# Patient Record
Sex: Female | Born: 1950 | Race: Black or African American | Hispanic: No | Smoking: Never smoker
Health system: Southern US, Community
[De-identification: ages and names within clinical notes are randomized; demographics above are authoritative.]

## PROBLEM LIST (undated history)

## (undated) DIAGNOSIS — I10 Essential (primary) hypertension: Secondary | ICD-10-CM

## (undated) HISTORY — DX: Essential (primary) hypertension: I10

---

## 1985-05-07 HISTORY — PX: TUBAL LIGATION: SHX77

## 1998-05-07 HISTORY — PX: CARDIAC CATHETERIZATION: SHX172

## 2011-08-10 ENCOUNTER — Encounter (HOSPITAL_COMMUNITY): Payer: Self-pay

## 2011-08-10 ENCOUNTER — Emergency Department (HOSPITAL_COMMUNITY)
Admission: EM | Admit: 2011-08-10 | Discharge: 2011-08-10 | Disposition: A | Discharge status: Home / Self Care | Payer: Self-pay | Source: Home / Self Care | Attending: Family Medicine | Admitting: Family Medicine

## 2011-08-10 ENCOUNTER — Emergency Department (INDEPENDENT_AMBULATORY_CARE_PROVIDER_SITE_OTHER): Payer: Self-pay

## 2011-08-10 DIAGNOSIS — S82409A Unspecified fracture of shaft of unspecified fibula, initial encounter for closed fracture: Secondary | ICD-10-CM

## 2011-08-10 MED ORDER — OXYCODONE-ACETAMINOPHEN 5-325 MG PO TABS: ORAL_TABLET | ORAL | Status: AC

## 2011-08-10 NOTE — ED Notes (Signed)
States she fell, w left foot/ankle folding up under her; significant swelling dorsum and posterior aspect, medial and laterally

## 2011-08-10 NOTE — Discharge Instructions (Signed)
Please call the Orthopaedic provider listed, Dr. Allie Bossier, on Monday and make an appointment for follow up. Take medications as directed for pain relief.

## 2011-08-10 NOTE — ED Provider Notes (Signed)
History     CSN: [id number]  Arrival date & time 08/10/11  1478   First MD Initiated Contact with Patient 08/10/11 1844      Chief Complaint  Patient presents with  . Ankle Pain    (Consider location/radiation/quality/duration/timing/severity/associated sxs/prior treatment) HPI Comments: Christie Rogers presents for evaluation of pain and swelling in her LEFT ankle. She states that she fell down her stairs (the last three), 9 days ago. She has continued to walk on it since that time.  Patient is a 61 y.o. female presenting with ankle pain.  Ankle Pain  The incident occurred more than 1 week ago. The incident occurred at home. The injury mechanism was a fall. The pain is present in the left ankle. The pain is moderate. The pain has been constant since onset. Pertinent negatives include no numbness, no inability to bear weight and no tingling. The symptoms are aggravated by activity, bearing weight and palpation.    History reviewed. No pertinent past medical history.  History reviewed. No pertinent past surgical history.  History reviewed. No pertinent family history.  History  Substance Use Topics  . Smoking status: Not on file  . Smokeless tobacco: Not on file  . Alcohol Use: Not on file    OB History    Grav Para Term Preterm Abortions TAB SAB Ect Mult Living                  Review of Systems  Constitutional: Negative.   HENT: Negative.   Eyes: Negative.   Respiratory: Negative.   Cardiovascular: Negative.   Gastrointestinal: Negative.   Genitourinary: Negative.   Musculoskeletal:       LEFT ankle pain and swelling  Skin: Negative.   Neurological: Negative.  Negative for tingling and numbness.    Allergies  Review of patient's allergies indicates no known allergies.  Home Medications   Current Outpatient Rx  Name Route Sig Dispense Refill  . AMLODIPINE BESYLATE-VALSARTAN 10-160 MG PO TABS Oral Take 1 tablet by mouth daily.    . OXYCODONE-ACETAMINOPHEN 5-325 MG  PO TABS  Take one to two tablets every 4 to 6 hours as needed for pain 20 tablet 0    BP 172/111  Pulse 83  Temp(Src) 98.7 F (37.1 C) (Oral)  Resp 18  SpO2 98%  Physical Exam  Nursing note and vitals reviewed. Constitutional: She is oriented to person, place, and time. She appears well-developed and well-nourished.  HENT:  Head: Normocephalic and atraumatic.  Eyes: EOM are normal.  Neck: Normal range of motion.  Pulmonary/Chest: Effort normal.  Musculoskeletal:       Left ankle: She exhibits decreased range of motion, swelling and ecchymosis. She exhibits no deformity. tenderness. Lateral malleolus, medial malleolus and proximal fibula tenderness found. No AITFL, no CF ligament, no posterior TFL and no head of 5th metatarsal tenderness found.  Neurological: She is alert and oriented to person, place, and time.  Skin: Skin is warm and dry.  Psychiatric: Her behavior is normal.    ED Course  Procedures (including critical care time)  Labs Reviewed - No data to display Dg Ankle Complete Left  08/10/2011  *RADIOLOGY REPORT*  Clinical Data: Fall.  Lateral ankle pain and swelling.  LEFT ANKLE COMPLETE - 3+ VIEW  Comparison: None.  Findings: A Weber B type oblique fracture of the lateral malleolus is present, with overlying soft tissue swelling.  No widening of the tibiotalar joint is observed.  There is a faint linear calcification adjacent to  the medial malleolus which could reflect a tiny avulsion along the deltoid ligament.  The plafond and talar dome appear otherwise intact.  Tibiotalar joint effusion noted on the lateral projection.  A small Achilles calcaneal spur is present.  IMPRESSION:  1.  Weber B fracture involving the lateral malleolus, possibly with a tiny avulsion from the medial malleolus. 2.  Tibiotalar joint effusion.  Original Report Authenticated By: Dellia Cloud, M.D.     1. Closed fibular fracture       MDM  Xray reviewed by radiologist and myself;  fractures per report above; placed in an ankle stirrup splint with posterior leg splint; will follow up with [facility] next week (Dr. Magnus Ivan); given rx for oxycodone/acetaminophen        Renaee Munda, MD 08/10/11 2130

## 2011-08-10 NOTE — Progress Notes (Signed)
Orthopedic Tech Progress Note Patient Details:  Christie Rogers Sep 25, 1950 [id number]  Type of Splint: Post (short);Stirrup Splint Location: left leg Splint Interventions: Application    Raffaele Derise 08/10/2011, 8:59 PM

## 2011-12-24 ENCOUNTER — Ambulatory Visit (INDEPENDENT_AMBULATORY_CARE_PROVIDER_SITE_OTHER): Payer: Self-pay | Admitting: Family Medicine

## 2011-12-24 ENCOUNTER — Encounter: Payer: Self-pay | Admitting: Family Medicine

## 2011-12-24 VITALS — BP 162/107 | HR 88 | Temp 98.0°F | Ht 66.0 in | Wt 232.0 lb

## 2011-12-24 DIAGNOSIS — R05 Cough: Secondary | ICD-10-CM

## 2011-12-24 DIAGNOSIS — I10 Essential (primary) hypertension: Secondary | ICD-10-CM | POA: Insufficient documentation

## 2011-12-24 DIAGNOSIS — R002 Palpitations: Secondary | ICD-10-CM

## 2011-12-24 NOTE — Patient Instructions (Addendum)
We are going to do some bloodwork today.  I will let you know the results.  We will have you come back in about 2 weeks for a blood pressure check.  We may make some changes to your medications at that time.  Record release today.  I'd also like for you to have a chest xray due to the chronic cough that you've been having and the fact that both of your parents had lung cancer.  You can go whenever is best for you in the next 2 weeks.  You don't need an appointment  It was good to meet you today!

## 2011-12-24 NOTE — Progress Notes (Signed)
Patient ID: Christie Rogers, female   DOB: 1950/08/15, 61 y.o.   MRN: [id number] Christie Rogers is a 61 y.o. female who presents to [facility] today to establish care.  Main medical problem is hypertension.  Moved here from [place] after loss of mother and brother.  Daughter lives here in [place].  Been living here for 1 year, hasn't established care since being here.    1.  Hypertension:  Long-term problem for this patient, has had 3 hospitalizations for this in past, what sounds like hypertensive urgency.  Has been taking HCTZ-lisinopril combination. Not checking it regularly.  No HA, CP, dizziness, shortness of breath, palpitations, or LE swelling.  Per patient, she had negative cardiac cath in 2000. BP Readings from Last 3 Encounters:  12/24/11 162/107  08/10/11 172/111   2.  Cough:  Persistent for about one year. She is on an ACE inhibitor but it's only been on this for the past month or so. Cough predated ACE inhibitor use. She describes it as a dry hacking cough that is not worse at night. No history of asthma, postnasal drip, GERD. Both her parents did die from lung cancer. Her father was a smoker however her mother was not. No fevers or chills. No weight loss. No night sweats.  The following portions of the patient's history were reviewed and updated as appropriate: allergies, current medications, past medical history, family and social history, and problem list.  Patient is a nonsmoker.  Past Medical History  Diagnosis Date  . Hypertension     ROS as above otherwise neg. No Chest pain, palpitations, SOB, Fever, Chills, Abd pain, N/V/D.  Medications reviewed. Current Outpatient Prescriptions  Medication Sig Dispense Refill  . amLODipine-valsartan (EXFORGE) 10-160 MG per tablet Take 1 tablet by mouth daily.        Exam:  BP 162/107  Pulse 88  Temp 98 F (36.7 C) (Oral)  Ht 5\' 6"  (1.676 m)  Wt 232 lb (105.235 kg)  BMI 37.45 kg/m2 Gen: Well NAD HEENT:  [place]/AT.  EOMI,  PERRL, arcus senilis and pinguecula (medial aspect of eye) present BL.  MMM, tonsils non-erythematous, non-edematous.  External ears WNL, Bilateral TM's normal without retraction, redness or bulging.  Lungs: CTABL Nl WOB Heart: RRR no MRG Abd: NABS, NT, ND Exts: Non edematous BL  LE, warm and well perfused.   No results found for this or any previous visit (from the past 72 hour(s)).

## 2011-12-25 NOTE — Assessment & Plan Note (Signed)
Not controlled today. She is taking lisinopril hydrochlorothiazide. As she is [place] we may need to switch her from an ACE inhibitor to possibly dihydropyridine calcium channel blocker. We'll obtain lab work later this week after she has [employer] access card and at that point we will switch her to a new blood pressure medication. Followup in 2 weeks

## 2011-12-25 NOTE — Assessment & Plan Note (Signed)
History of this for one year. Patient also has strong family history of lung cancer. Will obtain chest x-ray sooner she qualifies for [employer] access card

## 2013-05-16 ENCOUNTER — Encounter (HOSPITAL_COMMUNITY): Payer: Self-pay | Admitting: Emergency Medicine

## 2013-05-16 ENCOUNTER — Emergency Department (HOSPITAL_COMMUNITY)
Admission: EM | Admit: 2013-05-16 | Discharge: 2013-05-16 | Disposition: A | Discharge status: Home / Self Care | Payer: BC Managed Care – PPO | Attending: Emergency Medicine | Admitting: Emergency Medicine

## 2013-05-16 DIAGNOSIS — Z23 Encounter for immunization: Secondary | ICD-10-CM | POA: Insufficient documentation

## 2013-05-16 DIAGNOSIS — Y929 Unspecified place or not applicable: Secondary | ICD-10-CM | POA: Insufficient documentation

## 2013-05-16 DIAGNOSIS — W260XXA Contact with knife, initial encounter: Secondary | ICD-10-CM | POA: Insufficient documentation

## 2013-05-16 DIAGNOSIS — Z95818 Presence of other cardiac implants and grafts: Secondary | ICD-10-CM | POA: Insufficient documentation

## 2013-05-16 DIAGNOSIS — I10 Essential (primary) hypertension: Secondary | ICD-10-CM | POA: Insufficient documentation

## 2013-05-16 DIAGNOSIS — S61209A Unspecified open wound of unspecified finger without damage to nail, initial encounter: Secondary | ICD-10-CM | POA: Insufficient documentation

## 2013-05-16 DIAGNOSIS — S61311A Laceration without foreign body of left index finger with damage to nail, initial encounter: Secondary | ICD-10-CM

## 2013-05-16 DIAGNOSIS — W261XXA Contact with sword or dagger, initial encounter: Secondary | ICD-10-CM

## 2013-05-16 DIAGNOSIS — Z79899 Other long term (current) drug therapy: Secondary | ICD-10-CM | POA: Insufficient documentation

## 2013-05-16 DIAGNOSIS — Y93G9 Activity, other involving cooking and grilling: Secondary | ICD-10-CM | POA: Insufficient documentation

## 2013-05-16 MED ORDER — TETANUS-DIPHTH-ACELL PERTUSSIS 5-2.5-18.5 LF-MCG/0.5 IM SUSP
0.5000 mL | Freq: Once | INTRAMUSCULAR | Status: AC
  Administered 2013-05-16: 0.5 mL via INTRAMUSCULAR
  Filled 2013-05-16: qty 0.5

## 2013-05-16 NOTE — ED Notes (Signed)
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

## 2013-05-16 NOTE — ED Provider Notes (Signed)
CSN: [id number]     Arrival date & time 05/16/13  2153 History  This chart was scribed for non-physician practitioner, Dierdre Forth, PA-C working with Candyce Churn, MD by Greggory Stallion, ED scribe. This patient was seen in room TR11C/TR11C and the patient's care was started at 10:55 PM.   Chief Complaint  Patient presents with  . Laceration   The history is provided by the patient. No language interpreter was used.   HPI Comments: Christie Rogers is a 63 y.o. female with history of HTN who presents to the Emergency Department complaining of a laceration to the tip of her left index finger that occurred about one hour ago. 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 Pt states the knife was clean. Her last tetanus was about 5 years ago. She applied pressure at home, but denies pain.  Nothing makes it better or worse.  She denies fever, chills, headache, nausea and vomiting.  Past Medical History  Diagnosis Date  . Hypertension    Past Surgical History  Procedure Laterality Date  . Tubal ligation  1987  . Cardiac catheterization  2000    "Clean cath" according to patient - August 2013.  Awaiting records from [place]   Family History  Problem Relation Age of Onset  . Lung cancer Mother   . Lung cancer Father    History  Substance Use Topics  . Smoking status: Never Smoker   . Smokeless tobacco: Not on file  . Alcohol Use: No   OB History   Grav Para Term Preterm Abortions TAB SAB Ect Mult Living                 Review of Systems  Constitutional: Negative for fever, diaphoresis, appetite change, fatigue and unexpected weight change.  HENT: Negative for mouth sores.   Eyes: Negative for visual disturbance.  Respiratory: Negative for cough, chest tightness, shortness of breath and wheezing.   Cardiovascular: Negative for chest pain.  Gastrointestinal: Negative for nausea, vomiting, abdominal pain, diarrhea and  constipation.  Endocrine: Negative for polydipsia, polyphagia and polyuria.  Genitourinary: Negative for dysuria, urgency, frequency and hematuria.  Musculoskeletal: Negative for back pain and neck stiffness.  Skin: Positive for wound. Negative for rash.  Allergic/Immunologic: Negative for immunocompromised state.  Neurological: Negative for syncope, light-headedness and headaches.  Hematological: Does not bruise/bleed easily.  Psychiatric/Behavioral: Negative for sleep disturbance. The patient is not nervous/anxious.     Allergies  Review of patient's allergies indicates no known allergies.  Home Medications   Current Outpatient Rx  Name  Route  Sig  Dispense  Refill  . nebivolol (BYSTOLIC) 5 MG tablet   Oral   Take 5 mg by mouth daily.         . Olmesartan-Amlodipine-HCTZ (TRIBENZOR) 40-10-25 MG TABS   Oral   Take 1 tablet by mouth daily.          BP 148/100  Pulse 66  Resp 12  SpO2 94%  Physical Exam  Nursing note and vitals reviewed. Constitutional: She is oriented to person, place, and time. She appears well-developed and well-nourished. No distress.  HENT:  Head: Normocephalic and atraumatic.  Eyes: Conjunctivae are normal. No scleral icterus.  Neck: Normal range of motion.  Cardiovascular: Normal rate, regular rhythm, normal heart sounds and intact distal pulses.   No murmur heard. Capillary refill < 3 sec  Pulmonary/Chest: Effort normal and breath sounds normal. No respiratory distress.  Musculoskeletal: Normal range of motion. She exhibits no edema.  ROM: Full ROM of all fingers of the left hand  Neurological: She is alert and oriented to person, place, and time.  Sensation: intact to dull and sharp from base to the tip Strength: 5/5 including resisted flexion and extension  Skin: Skin is warm and dry. She is not diaphoretic.  2.5 cm laceration to tip of left pointer finger on the palmar side. No nail involvement.   Psychiatric: She has a normal mood and  affect.    ED Course  Procedures (including critical care time)  DIAGNOSTIC STUDIES: Oxygen Saturation is 94% on room air, adequate by my interpretation.    COORDINATION OF CARE: 11:00 PM-Discussed treatment plan which includes laceration repair and updating tetanus with pt at bedside and pt agreed to plan.   LACERATION REPAIR PROCEDURE NOTE The patient's identification was confirmed and consent was obtained. This procedure was performed by Candyce ChurnJohn David Wofford, MD at 11:00 PM. Site: tip of left pointer finger on palmar side Sterile procedures observed Anesthetic used (type and amt): 2 mL 2% lidocaine without epi Suture type/size: 6-0 Prolene  Length: 2 # of Sutures: 4 Technique: simple interrupted Complexity: simple Tetanus ordered Site anesthetized, irrigated with NS, explored without evidence of foreign body, wound well approximated, site covered with dry, sterile dressing.  Patient tolerated procedure well without complications. Instructions for care discussed verbally and patient provided with additional written instructions for homecare and f/u.  Labs Review Labs Reviewed - No data to display Imaging Review No results found.  EKG Interpretation   None       MDM   1. Laceration of left index finger w/o foreign body with damage to nail, initial encounter      Christie Rogers presents with laceration to the tip of the left index finger.  Tdap booster given.  Pressure irrigation performed. Laceration occurred < 8 hours prior to repair which was well tolerated. Pt has no co morbidities to effect normal wound healing. Discussed suture home care w pt and answered questions. Pt to f-u for wound check and suture removal in 7 days. Pt is hemodynamically stable w no complaints prior to dc.    It has been determined that no acute conditions requiring further emergency intervention are present at this time. The patient/guardian have been advised of the diagnosis and  plan. We have discussed signs and symptoms that warrant return to the ED, such as changes or worsening in symptoms.   Vital signs are stable at discharge.   BP 148/100  Pulse 66  Resp 12  SpO2 94%  Patient/guardian has voiced understanding and agreed to follow-up with the PCP or specialist.    I personally performed the services described in this documentation, which was scribed in my presence. The recorded information has been reviewed and is accurate.    Christie ClientHannah Annaelle Kasel, PA-C 05/16/13 2341

## 2013-05-16 NOTE — Discharge Instructions (Signed)
1. Medications: usual home medications 2. Treatment: rest, drink plenty of fluids, ice, elevate, keep wound clean and bandages dry 3. Follow Up: Please followup with your primary doctor for discussion of your diagnoses and further evaluation after today's visit in 7-10 days for wound check and suture removal.  Follow up with your doctor, an urgent care, or this Emergency Department for removal of your stitches in 7 days. Do not submerge the stitches in water for the first 24 hours.  Read instructions below.  TREATMENT   Keep the wound clean and dry.   If you were given a bandage (dressing), you should change it at least once a day. Also, change the dressing if it becomes wet or dirty, or as directed by your caregiver.   Wash the wound with soap and water 2 times a day. Rinse the wound off with water to remove all soap. Pat the wound dry with a clean towel.   You may shower as usual after the first 24 hours. Do not soak the wound in water until the sutures are removed.   Once the wound has healed, scarring can be minimized by covering the wound with sunscreen during the day for 1 full year.Marland Kitchen.   SEEK MEDICAL CARE IF:   You have redness, swelling, or increasing pain in the wound.   You see a red line that goes away from the wound.   You have yellowish-white fluid (pus) coming from the wound.   You have a fever.   You notice a bad smell coming from the wound or dressing.   Your wound breaks open before or after sutures have been removed.   You notice something coming out of the wound such as wood or glass.   Your wound is on your hand or foot and you cannot move a finger or toe.   Your pain is not controlled with prescribed medicine.   If you did not receive a tetanus shot today because you thought you were up to date, but did not recall when your last one was given, make sure to check with your primary caregiver to determine if you need one.

## 2013-05-16 NOTE — ED Notes (Signed)
[facility]

## 2013-05-18 NOTE — ED Provider Notes (Signed)
Medical screening examination/treatment/procedure(s) were performed by non-physician practitioner and as supervising physician I was immediately available for consultation/collaboration.  Eleshia Wooley David Jourdyn Hasler, MD 05/18/13 1350 

## 2013-05-25 ENCOUNTER — Encounter (HOSPITAL_COMMUNITY): Payer: Self-pay | Admitting: Emergency Medicine

## 2013-05-25 ENCOUNTER — Emergency Department (INDEPENDENT_AMBULATORY_CARE_PROVIDER_SITE_OTHER)
Admission: EM | Admit: 2013-05-25 | Discharge: 2013-05-25 | Disposition: A | Discharge status: Home / Self Care | Payer: BC Managed Care – PPO | Source: Home / Self Care | Attending: Emergency Medicine | Admitting: Emergency Medicine

## 2013-05-25 DIAGNOSIS — T148XXA Other injury of unspecified body region, initial encounter: Secondary | ICD-10-CM

## 2013-05-25 DIAGNOSIS — IMO0002 Reserved for concepts with insufficient information to code with codable children: Secondary | ICD-10-CM

## 2013-05-25 NOTE — Discharge Instructions (Signed)
Wash with soap and water and apply antibiotic ointment for next 24 hours.  Keep covered during day for next 3 days.

## 2013-05-25 NOTE — ED Provider Notes (Signed)
Chief Complaint:   Chief Complaint  Patient presents with  . Suture / Staple Removal    History of Present Illness:   Christie Rogers is a 63 year old female who lacerated the tip of her left index finger 9 days ago on a clean knife at home. This was sutured in the emergency room and she returns today for suture removal. It's been healing well without any evidence of infection. There is no redness, swelling, pain, or purulent drainage. All joints have a full range of motion. She denies any numbness or tingling.  Review of Systems:  Other than noted above, the patient denies any of the following symptoms: Systemic:  No fever or chills. Musculoskeletal:  No joint pain or decreased range of motion. Neuro:  No numbness, tingling, or weakness.  PMFSH:  Past medical history, family history, social history, meds, and allergies were reviewed. She has high blood pressure and takes AstronomerBystolic and Tribenzor.  Physical Exam:   Vital signs:  BP 136/84  Pulse 70  Temp(Src) 98.5 F (36.9 C) (Oral)  Resp 16  SpO2 100% Ext:  There is a sutured, well-healed laceration on the tip of the left index finger. No swelling, erythema, purulent drainage, or tenderness to palpation. All joints have a full range of motion. Sensation was intact to light touch.  All other joints had a full ROM without pain.  Pulses were full.  Good capillary refill in all digits.  No edema. Neurological:  Alert and oriented.  No muscle weakness.  Sensation was intact to light touch.   Procedure: Verbal informed consent was obtained.  The patient was informed of the risks and benefits of the procedure and understands and accepts.  Identity of the patient was verified verbally and by wristband. The area was prepped with alcohol and the sutures were stepped out. Antibiotic ointment was applied and a Band-Aid.  Assessment:  The encounter diagnosis was Laceration.  Well healed with no evidence of infection.  Plan:   1.  Meds:  The  following meds were prescribed:   Discharge Medication List as of 05/25/2013  1:52 PM      2.  Patient Education/Counseling:  The patient was given appropriate handouts, self care instructions, and instructed in symptomatic relief. Instructions were given for wound care.   3.  Follow up:  The patient was told to follow up immediately if there is any sign of infection.  Reuben Likesavid C Alvin Diffee, MD 05/25/13 [phone]

## 2013-05-25 NOTE — ED Notes (Signed)
Pt here for suture removal.  Denies pain.  No signs of infection.  Pt voices no complaints at this time.

## 2013-08-13 ENCOUNTER — Encounter (HOSPITAL_COMMUNITY): Payer: Self-pay | Admitting: Emergency Medicine

## 2013-08-13 ENCOUNTER — Emergency Department (HOSPITAL_COMMUNITY): Payer: BC Managed Care – PPO

## 2013-08-13 ENCOUNTER — Emergency Department (HOSPITAL_COMMUNITY)
Admission: EM | Admit: 2013-08-13 | Discharge: 2013-08-13 | Disposition: A | Discharge status: Home / Self Care | Payer: Self-pay | Attending: Emergency Medicine | Admitting: Emergency Medicine

## 2013-08-13 DIAGNOSIS — R059 Cough, unspecified: Secondary | ICD-10-CM | POA: Insufficient documentation

## 2013-08-13 DIAGNOSIS — R05 Cough: Secondary | ICD-10-CM | POA: Insufficient documentation

## 2013-08-13 DIAGNOSIS — J189 Pneumonia, unspecified organism: Secondary | ICD-10-CM | POA: Insufficient documentation

## 2013-08-13 DIAGNOSIS — R079 Chest pain, unspecified: Secondary | ICD-10-CM

## 2013-08-13 DIAGNOSIS — I10 Essential (primary) hypertension: Secondary | ICD-10-CM | POA: Insufficient documentation

## 2013-08-13 DIAGNOSIS — R062 Wheezing: Secondary | ICD-10-CM | POA: Insufficient documentation

## 2013-08-13 DIAGNOSIS — J3489 Other specified disorders of nose and nasal sinuses: Secondary | ICD-10-CM | POA: Insufficient documentation

## 2013-08-13 DIAGNOSIS — R490 Dysphonia: Secondary | ICD-10-CM | POA: Insufficient documentation

## 2013-08-13 DIAGNOSIS — Z79899 Other long term (current) drug therapy: Secondary | ICD-10-CM | POA: Insufficient documentation

## 2013-08-13 LAB — CBC
HEMATOCRIT: 32.5 % — AB (ref 36.0–46.0)
Hemoglobin: 11.1 g/dL — ABNORMAL LOW (ref 12.0–15.0)
MCH: 30.1 pg (ref 26.0–34.0)
MCHC: 34.2 g/dL (ref 30.0–36.0)
MCV: 88.1 fL (ref 78.0–100.0)
Platelets: 300 10*3/uL (ref 150–400)
RBC: 3.69 MIL/uL — AB (ref 3.87–5.11)
RDW: 13 % (ref 11.5–15.5)
WBC: 5 10*3/uL (ref 4.0–10.5)

## 2013-08-13 LAB — BASIC METABOLIC PANEL
BUN: 16 mg/dL (ref 6–23)
CHLORIDE: 103 meq/L (ref 96–112)
CO2: 24 meq/L (ref 19–32)
CREATININE: 0.96 mg/dL (ref 0.50–1.10)
Calcium: 9.6 mg/dL (ref 8.4–10.5)
GFR calc Af Amer: 72 mL/min — ABNORMAL LOW (ref >90)
GFR calc non Af Amer: 62 mL/min — ABNORMAL LOW (ref >90)
Glucose, Bld: 102 mg/dL — ABNORMAL HIGH (ref 70–99)
Potassium: 3.5 mEq/L — ABNORMAL LOW (ref 3.7–5.3)
Sodium: 143 mEq/L (ref 137–147)

## 2013-08-13 LAB — I-STAT TROPONIN, ED: Troponin i, poc: 0 ng/mL (ref 0.00–0.08)

## 2013-08-13 LAB — PRO B NATRIURETIC PEPTIDE: PRO B NATRI PEPTIDE: 139.2 pg/mL — AB (ref 0–125)

## 2013-08-13 MED ORDER — ALBUTEROL SULFATE (2.5 MG/3ML) 0.083% IN NEBU
5.0000 mg | INHALATION_SOLUTION | Freq: Once | RESPIRATORY_TRACT | Status: AC
  Administered 2013-08-13: 5 mg via RESPIRATORY_TRACT
  Filled 2013-08-13: qty 6

## 2013-08-13 MED ORDER — ALBUTEROL SULFATE HFA 108 (90 BASE) MCG/ACT IN AERS: 1.0000 | INHALATION_SPRAY | Freq: Four times a day (QID) | RESPIRATORY_TRACT | Status: AC | PRN

## 2013-08-13 MED ORDER — IPRATROPIUM BROMIDE 0.02 % IN SOLN
0.5000 mg | Freq: Once | RESPIRATORY_TRACT | Status: AC
  Administered 2013-08-13: 0.5 mg via RESPIRATORY_TRACT
  Filled 2013-08-13: qty 2.5

## 2013-08-13 MED ORDER — AZITHROMYCIN 250 MG PO TABS: 250.0000 mg | ORAL_TABLET | Freq: Every day | ORAL | Status: DC

## 2013-08-13 NOTE — ED Provider Notes (Signed)
CSN: [id number]     Arrival date & time 08/13/13  0538 History   First MD Initiated Contact with Patient 08/13/13 [phone]     Chief Complaint  Patient presents with  . Chest Pain     (Consider location/radiation/quality/duration/timing/severity/associated sxs/prior Treatment) The history is provided by the patient and medical records.   This is a 63 y.o. F with us past medical history significant for hypertension and cardiomegaly, presenting to the ED for chest pain. 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 A few days ago she started experiencing a sore throat and hoarse voice, since then she's developed a productive cough and subjective fever with one episode of sweating throughout the night.  States this morning she awoke experience a brief episode of sharp pain in her midsternal region.  She endorses mild shortness of breath.  She denies palpitations, dizziness, weakness.  No prior hx of asthma.  Pt is not a smoker.  Was previously followed by cardiology but is not currently.  Pt mildly hypertensive on arrival-- has not yet taken morning meds.  CP free on arrival.  Past Medical History  Diagnosis Date  . Hypertension    Past Surgical History  Procedure Laterality Date  . Tubal ligation  1987  . Cardiac catheterization  2000    "Clean cath" according to patient - August 2013.  [place]   Family History  Problem Relation Age of Onset  . Lung cancer Mother   . Lung cancer Father    History  Substance Use Topics  . Smoking status: Never Smoker   . Smokeless tobacco: Never Used  . Alcohol Use: No   OB History   Grav Para Term Preterm Abortions TAB SAB Ect Mult Living                 Review of Systems  HENT: Positive for congestion and voice change.   Respiratory: Positive for cough.   Cardiovascular: Positive for chest pain.  All other systems reviewed and are  negative.     Allergies  Review of patient's allergies indicates no known allergies.  Home Medications   Current Outpatient Rx  Name  Route  Sig  Dispense  Refill  . nebivolol (BYSTOLIC) 5 MG tablet   Oral   Take 5 mg by mouth daily.         . Olmesartan-Amlodipine-HCTZ (TRIBENZOR) 40-10-25 MG TABS   Oral   Take 1 tablet by mouth daily.          BP 155/105  Pulse 87  Temp(Src) 98.3 F (36.8 C) (Oral)  Resp 26  SpO2 99%  Physical Exam  Nursing note and vitals reviewed. Constitutional: She is oriented to person, place, and time. She appears well-developed and well-nourished.  HENT:  Head: Normocephalic and atraumatic.  Right Ear: Tympanic membrane and ear canal normal.  Left Ear: Tympanic membrane and ear canal normal.  Nose: Nose normal.  Mouth/Throat: Uvula is midline, oropharynx is clear and moist and mucous membranes are normal. No oropharyngeal exudate, posterior oropharyngeal edema, posterior oropharyngeal erythema or tonsillar abscesses.  Hoarse voice  Eyes: Conjunctivae and EOM are normal. Pupils are equal, round, and reactive to light.  Neck: Normal range of motion. Neck supple.  Cardiovascular: Normal rate, regular rhythm and normal heart sounds.   Pulmonary/Chest: Effort normal. No accessory muscle usage. Not tachypneic. No respiratory distress. She has no decreased breath  sounds. She has wheezes. She has no rhonchi. She has no rales.  Expiratory wheezes of bilateral upper lobes, L > R; no respiratory distress; able to speak in full complete sentences without difficulty  Abdominal: Soft. Bowel sounds are normal.  Musculoskeletal: Normal range of motion.  Neurological: She is alert and oriented to person, place, and time.  Skin: Skin is warm and dry. She is not diaphoretic.  Psychiatric: She has a normal mood and affect.    ED Course  Procedures (including critical care time)   Date: 08/13/2013  Rate: 95  Rhythm: normal sinus rhythm  QRS Axis:  normal  Intervals: normal  ST/T Wave abnormalities: normal  Conduction Disutrbances:none  Narrative Interpretation:   Old EKG Reviewed: unchanged   Labs Review Labs Reviewed  CBC - Abnormal; Notable for the following:    RBC 3.69 (*)    Hemoglobin 11.1 (*)    HCT 32.5 (*)    All other components within normal limits  BASIC METABOLIC PANEL - Abnormal; Notable for the following:    Potassium 3.5 (*)    Glucose, Bld 102 (*)    GFR calc non Af Amer 62 (*)    GFR calc Af Amer 72 (*)    All other components within normal limits  PRO B NATRIURETIC PEPTIDE - Abnormal; Notable for the following:    Pro B Natriuretic peptide (BNP) 139.2 (*)    All other components within normal limits  I-STAT TROPOININ, ED   Imaging Review Dg Chest 2 View  08/13/2013   CLINICAL DATA:  63 year old female with chest pain cough and shortness of Breath. Initial encounter.  EXAM: CHEST  2 VIEW  COMPARISON:  None.  FINDINGS: Mildly low lung volumes. Streaky and confluent anterior inferior left upper lobe opacity. No pleural effusion, pneumothorax or pulmonary edema. Tortuous thoracic aorta. Other mediastinal contours are within normal limits. Visualized tracheal air column is within normal limits. No acute osseous abnormality identified.  IMPRESSION: Confluent left upper lobe opacity, nonspecific but commonly due to bronchopneumonia. Clinical correlation recommended.   Electronically Signed   By: Augusto Gamble M.D.   On: 08/13/2013 07:09     EKG Interpretation None      MDM   Final diagnoses:  CAP (community acquired pneumonia)  Chest pain   On exam, pt has expiratory wheezes of bilateral upper lobes, L > R.  She has no hx of asthma or COPD and is not a smoker.  She is not in any respiratory distress, O2 sats stable on RA at  100%.  EKG, CXR, labs pending at this time.  Pt given albuterol/atrovent neb tx. Will continue to monitor closely.  EKG NSR, no acute ischemic changes.  Trop negative.  Labs reassuring.   CXR with LUL pneumonia which is likely the source of her sx.  Doubt ACS, PE, dissection, or other acute cardiac event at this time.  Wheezing has resolved after neb treatement, pt states her chest feels more "open" and easier to breathe.  Pt did have 1 documented de-sat to 74% while in room, however i believe this was an error or bad waveform as 1 minute later she was 100%.  She has had no signs of respiratory distress while in the ED and does not currently feel SOB.  She has been CP free while in the ED.  She will be discharged home with azithromycin and albuterol inhaler.  FU with PCP.  Discussed plan with pt, she acknowledged understanding and agreed with plan of  care.  Return precautions given for new or worsening symptoms.  Garlon Hatchet, PA-C 08/13/13 1052

## 2013-08-13 NOTE — ED Notes (Signed)
Patient gone to xray 

## 2013-08-13 NOTE — ED Notes (Signed)
PA at bedside discussing POC; Pt ambulated to restroom with mild SOB. Voice is hoarse.

## 2013-08-13 NOTE — ED Notes (Signed)
Pt c/o CP mid chest that is worse with laying down, and some relief with sitting. Pt broke out in sweat with nausea. Denies vomiting. Hx of enlarged heart. Cough x 1 week

## 2013-08-13 NOTE — ED Notes (Signed)
Patient is back from X-ray.

## 2013-08-13 NOTE — Progress Notes (Signed)
ED CM received [facility] for [facility] assistance recommended by the pharmacy. CM reviewed EPIC notes and chart review information. CM spoke with the pt about [facility] ($3 co pay for each Rx through [facility], does not include refills, 7 day expiration of MATCH letter and choice of pharmacies). Pt agreed to receive assistance from program. PDMI information entered. [facility] [address]. CM called pharmacy and spoke with Jomarie LongsJoseph, pharmacy tech, provided patient information and that the [facility] will [facility] and that patient is aware of copay. Ferdinand CavaAndrea Schettino, RN, BSN, Case Managers 08/13/2013 12:36 PM

## 2013-08-13 NOTE — Discharge Instructions (Signed)
Take the prescribed medication as directed.  Rest and drink plenty of fluids. Follow-up with your primary care physician. Return to the ED for new or worsening symptoms-- increasing pain, shortness of breath, high fever, palpitations, dizziness, weakness, etc.

## 2013-08-13 NOTE — ED Notes (Signed)
Explained plan of care to the patient, and applied breathing treatment.  Patient has no concerns at this time.

## 2013-08-13 NOTE — ED Notes (Signed)
Lisa, PA-C at the bedside.  

## 2013-08-13 NOTE — ED Notes (Signed)
PT ambulated with baseline gait; VSS; A&Ox3; no signs of distress; respirations even and unlabored; skin warm and dry; no questions upon discharge.  

## 2013-08-14 NOTE — ED Provider Notes (Signed)
Medical screening examination/treatment/procedure(s) were performed by non-physician practitioner and as supervising physician I was immediately available for consultation/collaboration.   EKG Interpretation None       Zakarie Sturdivant, MD 08/14/13 0645 

## 2013-08-18 ENCOUNTER — Emergency Department (HOSPITAL_COMMUNITY): Payer: BC Managed Care – PPO

## 2013-08-18 ENCOUNTER — Encounter (HOSPITAL_COMMUNITY): Payer: Self-pay | Admitting: Emergency Medicine

## 2013-08-18 ENCOUNTER — Inpatient Hospital Stay (HOSPITAL_COMMUNITY)
Admission: EM | Admit: 2013-08-18 | Discharge: 2013-08-20 | DRG: 195 | Disposition: A | Discharge status: Home / Self Care | Payer: BC Managed Care – PPO | Attending: Internal Medicine | Admitting: Internal Medicine

## 2013-08-18 DIAGNOSIS — D649 Anemia, unspecified: Secondary | ICD-10-CM | POA: Diagnosis present

## 2013-08-18 DIAGNOSIS — R059 Cough, unspecified: Secondary | ICD-10-CM

## 2013-08-18 DIAGNOSIS — R0989 Other specified symptoms and signs involving the circulatory and respiratory systems: Secondary | ICD-10-CM

## 2013-08-18 DIAGNOSIS — J45909 Unspecified asthma, uncomplicated: Secondary | ICD-10-CM | POA: Diagnosis present

## 2013-08-18 DIAGNOSIS — Z598 Other problems related to housing and economic circumstances: Secondary | ICD-10-CM

## 2013-08-18 DIAGNOSIS — Z801 Family history of malignant neoplasm of trachea, bronchus and lung: Secondary | ICD-10-CM

## 2013-08-18 DIAGNOSIS — Z5987 Material hardship due to limited financial resources, not elsewhere classified: Secondary | ICD-10-CM

## 2013-08-18 DIAGNOSIS — E0590 Thyrotoxicosis, unspecified without thyrotoxic crisis or storm: Secondary | ICD-10-CM | POA: Diagnosis present

## 2013-08-18 DIAGNOSIS — E049 Nontoxic goiter, unspecified: Secondary | ICD-10-CM

## 2013-08-18 DIAGNOSIS — R05 Cough: Secondary | ICD-10-CM

## 2013-08-18 DIAGNOSIS — I10 Essential (primary) hypertension: Secondary | ICD-10-CM

## 2013-08-18 DIAGNOSIS — R0609 Other forms of dyspnea: Secondary | ICD-10-CM

## 2013-08-18 DIAGNOSIS — J189 Pneumonia, unspecified organism: Principal | ICD-10-CM | POA: Diagnosis present

## 2013-08-18 LAB — TROPONIN I
Troponin I: <0.3 ng/mL (ref <0.30)
Troponin I: <0.3 ng/mL (ref <0.30)

## 2013-08-18 LAB — CBC WITH DIFFERENTIAL/PLATELET
Basophils Absolute: 0.1 10*3/uL (ref 0.0–0.1)
Basophils Relative: 1 % (ref 0–1)
EOS PCT: 4 % (ref 0–5)
Eosinophils Absolute: 0.2 10*3/uL (ref 0.0–0.7)
HCT: 34.4 % — ABNORMAL LOW (ref 36.0–46.0)
Hemoglobin: 11.5 g/dL — ABNORMAL LOW (ref 12.0–15.0)
LYMPHS ABS: 1.7 10*3/uL (ref 0.7–4.0)
LYMPHS PCT: 35 % (ref 12–46)
MCH: 30.1 pg (ref 26.0–34.0)
MCHC: 33.4 g/dL (ref 30.0–36.0)
MCV: 90.1 fL (ref 78.0–100.0)
Monocytes Absolute: 0.5 10*3/uL (ref 0.1–1.0)
Monocytes Relative: 10 % (ref 3–12)
NEUTROS ABS: 2.4 10*3/uL (ref 1.7–7.7)
Neutrophils Relative %: 50 % (ref 43–77)
PLATELETS: 332 10*3/uL (ref 150–400)
RBC: 3.82 MIL/uL — AB (ref 3.87–5.11)
RDW: 13.4 % (ref 11.5–15.5)
WBC: 4.8 10*3/uL (ref 4.0–10.5)

## 2013-08-18 LAB — BASIC METABOLIC PANEL
BUN: 25 mg/dL — ABNORMAL HIGH (ref 6–23)
CHLORIDE: 104 meq/L (ref 96–112)
CO2: 28 mEq/L (ref 19–32)
Calcium: 9.7 mg/dL (ref 8.4–10.5)
Creatinine, Ser: 1.06 mg/dL (ref 0.50–1.10)
GFR calc Af Amer: 64 mL/min — ABNORMAL LOW (ref >90)
GFR calc non Af Amer: 55 mL/min — ABNORMAL LOW (ref >90)
GLUCOSE: 107 mg/dL — AB (ref 70–99)
POTASSIUM: 3.7 meq/L (ref 3.7–5.3)
SODIUM: 145 meq/L (ref 137–147)

## 2013-08-18 LAB — TSH: TSH: 0.069 u[IU]/mL — AB (ref 0.350–4.500)

## 2013-08-18 LAB — STREP PNEUMONIAE URINARY ANTIGEN: Strep Pneumo Urinary Antigen: NEGATIVE

## 2013-08-18 LAB — PRO B NATRIURETIC PEPTIDE: Pro B Natriuretic peptide (BNP): 154.2 pg/mL — ABNORMAL HIGH (ref 0–125)

## 2013-08-18 MED ORDER — HEPARIN SODIUM (PORCINE) 5000 UNIT/ML IJ SOLN
5000.0000 [IU] | Freq: Three times a day (TID) | INTRAMUSCULAR | Status: DC
  Administered 2013-08-18 – 2013-08-20 (×5): 5000 [IU] via SUBCUTANEOUS
  Filled 2013-08-18 (×9): qty 1

## 2013-08-18 MED ORDER — SODIUM CHLORIDE 0.9 % IV SOLN
INTRAVENOUS | Status: DC
  Administered 2013-08-18: 09:00:00 via INTRAVENOUS

## 2013-08-18 MED ORDER — IPRATROPIUM-ALBUTEROL 0.5-2.5 (3) MG/3ML IN SOLN
3.0000 mL | Freq: Once | RESPIRATORY_TRACT | Status: AC
  Administered 2013-08-18: 3 mL via RESPIRATORY_TRACT
  Filled 2013-08-18: qty 3

## 2013-08-18 MED ORDER — SODIUM CHLORIDE 0.9 % IJ SOLN: 3.0000 mL | INTRAMUSCULAR | Status: DC | PRN

## 2013-08-18 MED ORDER — SODIUM CHLORIDE 0.9 % IJ SOLN
3.0000 mL | Freq: Two times a day (BID) | INTRAMUSCULAR | Status: DC
Start: 2013-08-18 — End: 2013-08-20
  Administered 2013-08-18 – 2013-08-20 (×4): 3 mL via INTRAVENOUS

## 2013-08-18 MED ORDER — SODIUM CHLORIDE 0.9 % IV BOLUS (SEPSIS)
250.0000 mL | Freq: Once | INTRAVENOUS | Status: AC
  Administered 2013-08-18: 250 mL via INTRAVENOUS

## 2013-08-18 MED ORDER — NEBIVOLOL HCL 5 MG PO TABS
5.0000 mg | ORAL_TABLET | Freq: Every day | ORAL | Status: DC
  Administered 2013-08-19: 5 mg via ORAL
  Filled 2013-08-18: qty 1

## 2013-08-18 MED ORDER — ALBUTEROL SULFATE (2.5 MG/3ML) 0.083% IN NEBU
2.5000 mg | INHALATION_SOLUTION | Freq: Once | RESPIRATORY_TRACT | Status: AC
  Administered 2013-08-18: 2.5 mg via RESPIRATORY_TRACT
  Filled 2013-08-18: qty 3

## 2013-08-18 MED ORDER — LEVOFLOXACIN 750 MG PO TABS: 750.0000 mg | ORAL_TABLET | Freq: Once | ORAL | Status: DC

## 2013-08-18 MED ORDER — SODIUM CHLORIDE 0.9 % IV SOLN: 250.0000 mL | INTRAVENOUS | Status: DC | PRN

## 2013-08-18 MED ORDER — IPRATROPIUM-ALBUTEROL 0.5-2.5 (3) MG/3ML IN SOLN
3.0000 mL | RESPIRATORY_TRACT | Status: DC
  Administered 2013-08-18 – 2013-08-20 (×10): 3 mL via RESPIRATORY_TRACT
  Filled 2013-08-18 (×12): qty 3

## 2013-08-18 MED ORDER — IPRATROPIUM-ALBUTEROL 0.5-2.5 (3) MG/3ML IN SOLN: 3.0000 mL | RESPIRATORY_TRACT | Status: DC

## 2013-08-18 MED ORDER — PREDNISONE 20 MG PO TABS
40.0000 mg | ORAL_TABLET | Freq: Every day | ORAL | Status: DC
  Administered 2013-08-19: 40 mg via ORAL
  Filled 2013-08-18 (×2): qty 2

## 2013-08-18 MED ORDER — GUAIFENESIN ER 600 MG PO TB12
600.0000 mg | ORAL_TABLET | Freq: Two times a day (BID) | ORAL | Status: DC | PRN
  Administered 2013-08-18: 600 mg via ORAL
  Filled 2013-08-18 (×3): qty 1

## 2013-08-18 MED ORDER — LEVOFLOXACIN IN D5W 750 MG/150ML IV SOLN
750.0000 mg | INTRAVENOUS | Status: DC
  Administered 2013-08-18: 750 mg via INTRAVENOUS
  Filled 2013-08-18 (×2): qty 150

## 2013-08-18 MED ORDER — LEVOFLOXACIN IN D5W 750 MG/150ML IV SOLN
750.0000 mg | Freq: Once | INTRAVENOUS | Status: AC
  Administered 2013-08-18: 750 mg via INTRAVENOUS
  Filled 2013-08-18: qty 150

## 2013-08-18 MED ORDER — PREDNISONE 20 MG PO TABS: 60.0000 mg | ORAL_TABLET | Freq: Once | ORAL | Status: DC

## 2013-08-18 MED ORDER — METHYLPREDNISOLONE SODIUM SUCC 125 MG IJ SOLR
125.0000 mg | Freq: Once | INTRAMUSCULAR | Status: AC
  Administered 2013-08-18: 125 mg via INTRAVENOUS
  Filled 2013-08-18: qty 2

## 2013-08-18 NOTE — ED Provider Notes (Signed)
CSN: [id number]     Arrival date & time 08/18/13  0732 History   First MD Initiated Contact with Patient 08/18/13 0745     Chief Complaint  Patient presents with  . Shortness of Breath  . Pneumonia     (Consider location/radiation/quality/duration/timing/severity/associated sxs/prior Treatment) Patient is a 63 y.o. female presenting with shortness of breath and pneumonia. The history is provided by the patient.  Shortness of Breath Associated symptoms: cough and wheezing   Associated symptoms: no abdominal pain, no chest pain, no fever, no headaches, no rash and no vomiting   Pneumonia Associated symptoms include shortness of breath. Pertinent negatives include no chest pain, no abdominal pain and no headaches.   63 year old female who seen April 9 for shortness of breath diagnosed with many acquired pneumonia discharged home on Z-Pak. Patient has completed Z-pak. Patient awoke this morning with significant shortness of breath has a very persistent cough. No history of COPD or asthma. Patient felt as if she was getting a little better from the pneumonia but then this shortness of breath attack occurred. Felt as if perhaps she was wheezing. No recent fevers. Patient is using albuterol inhaler at home. Patient not on steroids.  Past Medical History  Diagnosis Date  . Hypertension    Past Surgical History  Procedure Laterality Date  . Tubal ligation  1987  . Cardiac catheterization  2000    "Clean cath" according to patient - August 2013.  Awaiting records from [place]   Family History  Problem Relation Age of Onset  . Lung cancer Mother   . Lung cancer Father    History  Substance Use Topics  . Smoking status: Never Smoker   . Smokeless tobacco: Never Used  . Alcohol Use: No   OB History   Grav Para Term Preterm Abortions TAB SAB Ect Mult Living                 Review of Systems  Constitutional: Negative for fever.  HENT: Positive for congestion.   Eyes: Negative  for redness.  Respiratory: Positive for cough, shortness of breath and wheezing.   Cardiovascular: Negative for chest pain.  Gastrointestinal: Negative for nausea, vomiting and abdominal pain.  Genitourinary: Negative for dysuria.  Musculoskeletal: Negative for myalgias.  Skin: Negative for rash.  Neurological: Negative for headaches.  Hematological: Does not bruise/bleed easily.  Psychiatric/Behavioral: Negative for confusion.      Allergies  Review of patient's allergies indicates no known allergies.  Home Medications   Prior to Admission medications   Medication Sig Start Date End Date Taking? Authorizing Provider  acetaminophen (TYLENOL) 500 MG tablet Take 1,000 mg by mouth every 6 (six) hours as needed for moderate pain.   Yes Historical Provider, MD  albuterol (PROVENTIL HFA;VENTOLIN HFA) 108 (90 BASE) MCG/ACT inhaler Inhale 1-2 puffs into the lungs every 6 (six) hours as needed for wheezing. 08/13/13  Yes Garlon Hatchet, PA-C  fexofenadine-pseudoephedrine (ALLEGRA-D) 60-120 MG per tablet Take 1 tablet by mouth 2 (two) times daily as needed (cold symptoms).    Yes Historical Provider, MD  guaiFENesin (MUCINEX) 600 MG 12 hr tablet Take 600 mg by mouth 2 (two) times daily as needed for to loosen phlegm.    Yes Historical Provider, MD  nebivolol (BYSTOLIC) 5 MG tablet Take 5 mg by mouth daily.   Yes Historical Provider, MD  Olmesartan-Amlodipine-HCTZ (TRIBENZOR) 40-10-25 MG TABS Take 1 tablet by mouth daily.   Yes Historical Provider, MD  azithromycin (ZITHROMAX) 250 MG  tablet Take 250-500 mg by mouth See admin instructions. Take 500 mg on the first day, then 250 mg thereafter on days 2-5.    Historical Provider, MD   BP 110/94  Pulse 86  Temp(Src) 97.7 F (36.5 C) (Oral)  Resp 22  Ht 5' 6.5" (1.689 m)  Wt 225 lb (102.059 kg)  BMI 35.78 kg/m2  SpO2 99% Physical Exam  Nursing note and vitals reviewed. Constitutional: She is oriented to person, place, and time. She appears  well-developed and well-nourished.  HENT:  Head: Normocephalic and atraumatic.  Mouth/Throat: Oropharynx is clear and moist.  Eyes: Conjunctivae and EOM are normal. Pupils are equal, round, and reactive to light.  Neck: Normal range of motion.  Cardiovascular: Normal rate, regular rhythm and normal heart sounds.   No murmur heard. Pulmonary/Chest: She is in respiratory distress. She has wheezes. She exhibits no tenderness.  Diffuse rhonchi.  Abdominal: Soft. Bowel sounds are normal.  Musculoskeletal: Normal range of motion.  Neurological: She is alert and oriented to person, place, and time. No cranial nerve deficit. She exhibits normal muscle tone. Coordination normal.  Skin: Skin is warm. No rash noted.    ED Course  Procedures (including critical care time) Labs Review Labs Reviewed  CBC WITH DIFFERENTIAL - Abnormal; Notable for the following:    RBC 3.82 (*)    Hemoglobin 11.5 (*)    HCT 34.4 (*)    All other components within normal limits  BASIC METABOLIC PANEL - Abnormal; Notable for the following:    Glucose, Bld 107 (*)    BUN 25 (*)    GFR calc non Af Amer 55 (*)    GFR calc Af Amer 64 (*)    All other components within normal limits   Results for orders placed during the hospital encounter of 08/18/13  CBC WITH DIFFERENTIAL      Result Value Ref Range   WBC 4.8  4.0 - 10.5 K/uL   RBC 3.82 (*) 3.87 - 5.11 MIL/uL   Hemoglobin 11.5 (*) 12.0 - 15.0 g/dL   HCT 16.134.4 (*) 09.636.0 - 04.546.0 %   MCV 90.1  78.0 - 100.0 fL   MCH 30.1  26.0 - 34.0 pg   MCHC 33.4  30.0 - 36.0 g/dL   RDW 40.913.4  81.111.5 - 91.415.5 %   Platelets 332  150 - 400 K/uL   Neutrophils Relative % 50  43 - 77 %   Neutro Abs 2.4  1.7 - 7.7 K/uL   Lymphocytes Relative 35  12 - 46 %   Lymphs Abs 1.7  0.7 - 4.0 K/uL   Monocytes Relative 10  3 - 12 %   Monocytes Absolute 0.5  0.1 - 1.0 K/uL   Eosinophils Relative 4  0 - 5 %   Eosinophils Absolute 0.2  0.0 - 0.7 K/uL   Basophils Relative 1  0 - 1 %   Basophils  Absolute 0.1  0.0 - 0.1 K/uL  BASIC METABOLIC PANEL      Result Value Ref Range   Sodium 145  137 - 147 mEq/L   Potassium 3.7  3.7 - 5.3 mEq/L   Chloride 104  96 - 112 mEq/L   CO2 28  19 - 32 mEq/L   Glucose, Bld 107 (*) 70 - 99 mg/dL   BUN 25 (*) 6 - 23 mg/dL   Creatinine, Ser 7.821.06  0.50 - 1.10 mg/dL   Calcium 9.7  8.4 - 95.610.5 mg/dL   GFR calc  non Af Amer 55 (*) >90 mL/min   GFR calc Af Amer 64 (*) >90 mL/min     Imaging Review Dg Chest 2 View  08/18/2013   CLINICAL DATA:  Shortness of breath, pneumonia, cough  EXAM: CHEST  2 VIEW  COMPARISON:  08/14/1998 50  FINDINGS: Slight improvement in the left upper lobe/lingula streaky airspace opacity compatible with pneumonia. Right lung remains clear. No developing effusion or pneumothorax. Normal heart size and vascularity. Trachea midline. Degenerative changes of the spine.  IMPRESSION: Improving left upper lobe/ lingula pneumonia. Recommend continued radiographic follow-up to document complete resolution.   Electronically Signed   By: Ruel Favorsrevor  Shick M.D.   On: 08/18/2013 08:32     EKG Interpretation None      MDM   Final diagnoses:  Reactive airway disease  CAP (community acquired pneumonia)    Patient clinically shows signs of some improvement from the community acquired pneumonia. However is having marked problems with reactive airway disease rhonchi wheezing and persistent cough. Today's chest x-ray shows slight improvement. Patient treated with albuterol Atrovent nebulizer x2 and then albuterol nebulizer still having reactive airway disease problems. Patient also given her 25 mg Solu-Medrol. Patient primary care Dr. is Viann Shoveonya Martin. Patient will require admission she will be an unassigned admission. Patient's oxygen saturations are above 90%. The wheezing is just persisting. Patient also given a dose of IV Levaquin since the chest x-ray has shown only mild improvement in the pneumonia.    Shelda JakesScott W. Kaymon Denomme, MD 08/18/13 [phone]

## 2013-08-18 NOTE — H&P (Signed)
Date: 08/18/2013               Patient Name:  Christie Rogers MRN: [id number]  DOB: 1950/12/24 Age / Sex: 63 y.o., female   PCP: Tobey GrimJeffrey H Walden, MD         Medical Service: Internal Medicine Teaching Service         Attending Physician: Dr. Farley LyJerry Dale Joines, MD    First Contact: Dr. Vivi BarrackSarah Pistol Kessenich Pager: [phone]  Second Contact: Dr. Charlsie MerlesKathryn Glenn Pager: [phone]       After Hours (After 5p/  First Contact Pager: [phone]  weekends / holidays): Second Contact Pager: [phone]   Chief Complaint: Shortness of breath  History of Present Illness:  Christie Rogers is a 63 y.o. woman PMH HTN who presents to the ED with a chief complaint of shortness of breath.  Patient was seen in the ED on April 9 for shortness of breath and productive cough. CXR showed LUL consolidation suggestive of bronchopneumonia, so she was diagnosed with CAP and discharged home on Z-Pak. She had some wheezing and chest pain (sternal radiating to back) at that visit, which resolved with a DuoNeb treatment and Tylenol. EKG was NSR, no acute ischemic changes, and troponin was negative x1. She has no history of asthma or COPD, and she is not a smoker.   Her symptoms improved for about 2 days on the antibiotics, but then worsened. She has dyspnea worse on exertion, wheezing, sensation of not getting enough air, hoarse voice, and a continued cough productive of a small amount of sputum. Denies orthopnea, PND, leg swelling, weight gain (says she has lost weight). Denies chest pain since her ED visit on 4/9, weakness or numbness, abdominal pain, nausea, vomiting. She had a sore throat prior to her 4/9 ED visit but none currently. Has not noted cervical lymphadenopathy.   She tells us she had a cardiac work-up a few years ago [facility] in [place], around 2010. She had a 2D echo that showed an "enlarged heart" as well as a catheterization that she thinks was normal. She doesn't think she takes any medicines for  heart failure or heart attack, just HTN. She is on a beta-blocker at home, Nebivolol. She does not take a daily aspirin.   She has a family history of lung cancer in her mother and father. Her mother also developed asthma late in life. Hypertension runs in her family. 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  In the ED she was given DuoNebs x2, Solu-Medrol 125mg  IV x1, IV Levaquin, and IVF NS @100cc /hr.   Meds:  Current Outpatient Prescriptions  Medication Sig Dispense Refill  . acetaminophen (TYLENOL) 500 MG tablet Take 1,000 mg by mouth every 6 (six) hours as needed for moderate pain.      Marland Kitchen. albuterol (PROVENTIL HFA;VENTOLIN HFA) 108 (90 BASE) MCG/ACT inhaler Inhale 1-2 puffs into the lungs every 6 (six) hours as needed for wheezing.  1 Inhaler  0  . fexofenadine-pseudoephedrine (ALLEGRA-D) 60-120 MG per tablet Take 1 tablet by mouth 2 (two) times daily as needed (cold symptoms).       Marland Kitchen. guaiFENesin (MUCINEX) 600 MG 12 hr tablet Take 600 mg by mouth 2 (two) times daily as needed for to loosen phlegm.       . nebivolol (BYSTOLIC) 5 MG tablet Take 5 mg by mouth daily.      . Olmesartan-Amlodipine-HCTZ (TRIBENZOR) 40-10-25 MG TABS Take 1 tablet  by mouth daily.      Marland Kitchen azithromycin (ZITHROMAX) 250 MG tablet Take 250-500 mg by mouth See admin instructions. Take 500 mg on the first day, then 250 mg thereafter on days 2-5.        Allergies: Allergies as of 08/18/2013  . (No Known Allergies)   Past Medical History  Diagnosis Date  . Hypertension    Past Surgical History  Procedure Laterality Date  . Tubal ligation  1987  . Cardiac catheterization  2000    "Clean cath" according to patient - August 2013.  Awaiting records from [place]   Family History  Problem Relation Age of Onset  . Lung cancer Mother   . Lung cancer Father    History   Social History  . Marital Status: Divorced    Spouse Name: N/A    Number of Children: N/A  .  Years of Education: N/A   Occupational History  . Not on file.   Social History Main Topics  . Smoking status: Never Smoker   . Smokeless tobacco: Never Used  . Alcohol Use: No  . Drug Use: No  . Sexual Activity: No   Other Topics Concern  . Not on file   Social History Narrative   Works at [employer].  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  Physical Exam: Blood pressure 148/89, pulse 81, temperature 97.8 F (36.6 C), temperature source Oral, resp. rate 18, height 5' 6.5" (1.689 m), weight 225 lb (102.059 kg), SpO2 95.00%. Physical Exam  Constitutional: She is oriented to person, place, and time and well-developed, well-nourished, and in no distress.  Pleasant woman. Hoarse voice.  HENT:  Head: Normocephalic and atraumatic.  Mouth/Throat: Oropharynx is clear and moist and mucous membranes are normal. No oropharyngeal exudate, posterior oropharyngeal edema or posterior oropharyngeal erythema.  Eyes: Conjunctivae and EOM are normal. Pupils are equal, round, and reactive to light.  Neck: Normal range of motion. Neck supple. No JVD present. No tracheal deviation present. Thyromegaly (Goiter present, per patient this has been commented on by doctor's before) present.  Cardiovascular: Normal rate, regular rhythm, normal heart sounds and intact distal pulses.  Exam reveals no gallop and no friction rub.   No murmur heard. Pulmonary/Chest: No stridor. No respiratory distress. She has wheezes (Loud expiratory wheezes heard bilaterally and anteriorly). She has rales (Soft crackles left lung). She exhibits no tenderness.  Abdominal: Soft. Bowel sounds are normal. She exhibits no distension. There is no tenderness.  Musculoskeletal: Normal range of motion. She exhibits no edema and no tenderness.  Lymphadenopathy:    She has no cervical adenopathy.  Neurological: She is alert and oriented to person, place, and time.  GCS score is 15.  Skin: Skin is warm and dry. No rash noted. She is not diaphoretic. No erythema. No pallor.  Psychiatric: Mood, memory, affect and judgment normal.    Lab results: Basic Metabolic Panel:  Recent Labs  16/10/96 0853  NA 145  K 3.7  CL 104  CO2 28  GLUCOSE 107*  BUN 25*  CREATININE 1.06  CALCIUM 9.7   CBC:  Recent Labs  08/18/13 0853  WBC 4.8  NEUTROABS 2.4  HGB 11.5*  HCT 34.4*  MCV 90.1  PLT 332   Cardiac Enzymes:  Recent Labs  08/18/13 1334  TROPONINI <0.30   Thyroid Function Tests:  Recent Labs  08/18/13 1334  TSH 0.069*    Imaging results:  Dg Chest 2 View  08/18/2013   CLINICAL DATA:  Shortness of breath, pneumonia, cough  EXAM: CHEST  2 VIEW  COMPARISON:  08/14/1998 50  FINDINGS: Slight improvement in the left upper lobe/lingula streaky airspace opacity compatible with pneumonia. Right lung remains clear. No developing effusion or pneumothorax. Normal heart size and vascularity. Trachea midline. Degenerative changes of the spine.  IMPRESSION: Improving left upper lobe/ lingula pneumonia. Recommend continued radiographic follow-up to document complete resolution.   Electronically Signed   By: Ruel Favorsrevor  Shick M.D.   On: 08/18/2013 08:32    Other results: EKG: NSR, rate 81, no concerning ST/T wave changes. Similar to prior EKG 08/13/13.  Assessment & Plan by Problem: Christie Rogers is a 63 y.o. woman PMH HTN who presents to the ED with a chief complaint of shortness of breath.  #Dyspnea - Patient presents with worsening dyspnea and significant wheezing on exam after completing an outpatient course of azithromycin for CAP. No history of prior respiratory diseases. AVSS and satting well on room air. Differential includes obstructive lung disease (family history of adult onset asthma) vs. Infectious (recent history of CAP) vs. Cardiac. CXR shows improving left upper lobe/ lingula pneumonia. She is without fever or leukocytosis and has no left  shift. She has an unknown cardiac history of an "enlarged heart" and takes a beta-blocker. [place] We are working to obtain records and will perform further work up as needed. No signs of volume overload on exam. Pro-BNP 139 on 4/9, we will repeat this. Normal heart size and no effusion or edema on CXR. Hyperthyroidism is also on the differential, see below. - Admit to IMTS, telemetry - [facility] - 12-lead EKG - Troponins x3 - Pro-BNP - Duonebs q4h scheduled - Levaquin 750mg  IV daily - Prednisone 40mg  daily (she is s/p Solu-Medrol IV in ED) - Respiratory virus panel - Sputum culture and gram stain - Blood cultures x2 - Urine antigens - O2 prn - Mucinex BID prn - BMP, CBC in am  #HTN - Normotensive currently. Should be taking olmesartan-Amlodipine-HCTZ 40-10-25 mg once daily but patient cannot afford it. - Continue home nebivolol 5mg  daily - Can add additional meds (affordable) as needed  #Goiter - Per patient, this has been present for awhile and doctors often comment on it. TSH suppressed here at 0.068. Endorses weight loss, we have a weight from 2013 of 232lbs, today it is 225. - Checking free T4, T3, free T3  #DVT PPX - Heparin subq  Dispo: Disposition is deferred at this time, awaiting improvement of current medical problems. Anticipated discharge in approximately 1-3 day(s).   The patient does have a current PCP Tobey Grim(Jeffrey H Walden, MD) and does not [facility] after discharge.  The patient does not have transportation limitations that hinder transportation to clinic appointments.  Signed: Vivi BarrackSarah Skarlett Sedlacek, MD 08/18/2013, 3:21 PM    Vivi BarrackSarah Leniya Breit, MD  [email or web address] Pager # [phone] After hours and weekends # [phone] Office # [phone]

## 2013-08-18 NOTE — ED Notes (Signed)
63 yo female presents with SOB and expiratory wheezes. Reports finishing the zythromax that was prescribed on 4/9 and using her albuterol inhaler with no relief. Pt. Reports being dx with Pneumonia on 4/8. Currently A/O, no n/v/d.

## 2013-08-18 NOTE — H&P (Signed)
Internal Medicine Attending Admission Note Date: 08/18/2013  Patient name: Christie Rogers Medical record number: [id number] Date of birth: [birth year] Age: 63 y.o. Gender: female  I saw and evaluated the patient. I reviewed the resident's note and I agree with the resident's findings and plan as documented in the resident's note, with the following additional comments.  Chief Complaint(s): Shortness of breath; cough  History - key components related to admission: Patient is a 63 year old woman with history of hypertension and other problems as outlined in the medical history who presents with complaint of shortness of breath, wheezing, and persistent cough productive of yellow sputum.  She was seen in the emergency department on April 9 with similar symptoms; a chest x-ray then showed a left upper lobe opacity compatible with pneumonia.  Patient was treated with oral azithromycin; she reports some initial improvement of her symptoms, but then progressive worsening prompting her to return to the emergency department today.  Physical Exam - key components related to admission:  Filed Vitals:   08/18/13 1530 08/18/13 1628 08/18/13 1645 08/18/13 1650  BP: 146/90   143/75  Pulse: 81  81   Temp:      TempSrc:      Resp: 25  21   Height:      Weight:      SpO2: 92% 100% 96%     General: Alert, oriented Lungs: Diffuse coarse breath sounds with moderate expiratory wheezing Heart: Regular; no extra sounds or murmurs Abdomen: Bowel sounds present, soft, nontender; no hepatosplenomegaly Extremities: No edema  Lab results:   Basic Metabolic Panel:  Recent Labs  40/98/1104/14/15 0853  NA 145  K 3.7  CL 104  CO2 28  GLUCOSE 107*  BUN 25*  CREATININE 1.06  CALCIUM 9.7     CBC:  Recent Labs  08/18/13 0853  WBC 4.8  HGB 11.5*  HCT 34.4*  MCV 90.1  PLT 332    Recent Labs  08/18/13 0853  NEUTROABS 2.4  LYMPHSABS 1.7  MONOABS 0.5  EOSABS 0.2  BASOSABS 0.1    Cardiac  Enzymes:  Recent Labs  08/18/13 1334  TROPONINI <0.30    BNP:  Recent Labs  08/18/13 1446  PROBNP 154.2*     Thyroid Function Tests:  Recent Labs  08/18/13 1334  TSH 0.069*    Imaging results:  Dg Chest 2 View  08/18/2013   CLINICAL DATA:  Shortness of breath, pneumonia, cough  EXAM: CHEST  2 VIEW  COMPARISON:  08/14/1998 50  FINDINGS: Slight improvement in the left upper lobe/lingula streaky airspace opacity compatible with pneumonia. Right lung remains clear. No developing effusion or pneumothorax. Normal heart size and vascularity. Trachea midline. Degenerative changes of the spine.  IMPRESSION: Improving left upper lobe/ lingula pneumonia. Recommend continued radiographic follow-up to document complete resolution.   Electronically Signed   By: Ruel Favorsrevor  Shick M.D.   On: 08/18/2013 08:32    Other results: EKG: Sinus rhythm; probable left atrial enlargement; borderline left axis deviation  Assessment & Plan by Problem:  1.  Dyspnea and wheezing in patient with community-acquired pneumonia.  Patient presents with dyspnea and diffuse expiratory wheezing after completing a course of azithromycin for community-acquired pneumonia.  Her symptoms began with a sore throat, and this may have been a viral respiratory infection with secondary pneumonia; she has no history of asthma, and her wheezing is apparently due to airway irritation provoked by the respiratory infection.  She is feeling somewhat better after initial inhaled bronchodilator treatment and  a dose of IV Solu-Medrol given in the emergency department.  The plan is monitor; supplement oxygen and follow saturations; inhaled bronchodilators; continue steroids; empiric levofloxacin; check influenza PCR and respiratory virus panel.  Would get pre-and postbronchodilator peak flow measurements.  Once patient has recovered, pulmonary function testing will be appropriate.  2.  Goiter.  TSH is low; plan is check free T4 and free T3;  need to review outside records, consider imaging if not are ready done.  3.  Other problems and plans as per the resident physician's note.

## 2013-08-18 NOTE — ED Notes (Signed)
MD at bedside. 

## 2013-08-19 LAB — T3, FREE: T3 FREE: 2.9 pg/mL (ref 2.3–4.2)

## 2013-08-19 LAB — BASIC METABOLIC PANEL
BUN: 24 mg/dL — ABNORMAL HIGH (ref 6–23)
CALCIUM: 9.8 mg/dL (ref 8.4–10.5)
CO2: 20 mEq/L (ref 19–32)
Chloride: 104 mEq/L (ref 96–112)
Creatinine, Ser: 0.96 mg/dL (ref 0.50–1.10)
GFR, EST AFRICAN AMERICAN: 72 mL/min — AB (ref >90)
GFR, EST NON AFRICAN AMERICAN: 62 mL/min — AB (ref >90)
Glucose, Bld: 108 mg/dL — ABNORMAL HIGH (ref 70–99)
POTASSIUM: 3.8 meq/L (ref 3.7–5.3)
SODIUM: 139 meq/L (ref 137–147)

## 2013-08-19 LAB — RESPIRATORY VIRUS PANEL
ADENOVIRUS: NOT DETECTED
INFLUENZA A H3: NOT DETECTED
Influenza A H1: NOT DETECTED
Influenza A: NOT DETECTED
Influenza B: NOT DETECTED
Metapneumovirus: NOT DETECTED
PARAINFLUENZA 1 A: NOT DETECTED
Parainfluenza 2: NOT DETECTED
Parainfluenza 3: NOT DETECTED
RESPIRATORY SYNCYTIAL VIRUS A: NOT DETECTED
RESPIRATORY SYNCYTIAL VIRUS B: NOT DETECTED
Rhinovirus: NOT DETECTED

## 2013-08-19 LAB — INFLUENZA PANEL BY PCR (TYPE A & B)
H1N1 flu by pcr: NOT DETECTED
INFLAPCR: NEGATIVE
INFLBPCR: NEGATIVE

## 2013-08-19 LAB — RETICULOCYTES
RBC.: 3.89 MIL/uL (ref 3.87–5.11)
Retic Count, Absolute: 77.8 10*3/uL (ref 19.0–186.0)
Retic Ct Pct: 2 % (ref 0.4–3.1)

## 2013-08-19 LAB — CBC
HCT: 32 % — ABNORMAL LOW (ref 36.0–46.0)
HEMOGLOBIN: 11 g/dL — AB (ref 12.0–15.0)
MCH: 30.5 pg (ref 26.0–34.0)
MCHC: 34.4 g/dL (ref 30.0–36.0)
MCV: 88.6 fL (ref 78.0–100.0)
PLATELETS: 344 10*3/uL (ref 150–400)
RBC: 3.61 MIL/uL — AB (ref 3.87–5.11)
RDW: 13.3 % (ref 11.5–15.5)
WBC: 7.2 10*3/uL (ref 4.0–10.5)

## 2013-08-19 LAB — HIV ANTIBODY (ROUTINE TESTING W REFLEX): HIV 1&2 Ab, 4th Generation: NONREACTIVE

## 2013-08-19 LAB — VITAMIN B12: Vitamin B-12: 420 pg/mL (ref 211–911)

## 2013-08-19 LAB — TROPONIN I

## 2013-08-19 LAB — FERRITIN: Ferritin: 50 ng/mL (ref 10–291)

## 2013-08-19 LAB — IRON AND TIBC
Iron: 60 ug/dL (ref 42–135)
Saturation Ratios: 21 % (ref 20–55)
TIBC: 286 ug/dL (ref 250–470)
UIBC: 226 ug/dL (ref 125–400)

## 2013-08-19 LAB — T3: T3, Total: 88.7 ng/dl (ref 80.0–204.0)

## 2013-08-19 LAB — FOLATE: Folate: 9.3 ng/mL

## 2013-08-19 LAB — T4, FREE: Free T4: 1.73 ng/dL (ref 0.80–1.80)

## 2013-08-19 MED ORDER — METHYLPREDNISOLONE SODIUM SUCC 125 MG IJ SOLR
60.0000 mg | Freq: Two times a day (BID) | INTRAMUSCULAR | Status: DC
  Administered 2013-08-19 – 2013-08-20 (×2): 60 mg via INTRAVENOUS
  Filled 2013-08-19 (×5): qty 0.96

## 2013-08-19 MED ORDER — AMLODIPINE BESYLATE 10 MG PO TABS
10.0000 mg | ORAL_TABLET | Freq: Every day | ORAL | Status: DC
  Administered 2013-08-19 – 2013-08-20 (×2): 10 mg via ORAL
  Filled 2013-08-19 (×2): qty 1

## 2013-08-19 MED ORDER — LEVOFLOXACIN 750 MG PO TABS
750.0000 mg | ORAL_TABLET | Freq: Every day | ORAL | Status: DC
  Administered 2013-08-20: 750 mg via ORAL
  Filled 2013-08-19: qty 1

## 2013-08-19 MED ORDER — AMLODIPINE BESYLATE 5 MG PO TABS
5.0000 mg | ORAL_TABLET | Freq: Every day | ORAL | Status: DC
  Filled 2013-08-19: qty 1

## 2013-08-19 MED ORDER — PNEUMOCOCCAL VAC POLYVALENT 25 MCG/0.5ML IJ INJ
0.5000 mL | INJECTION | INTRAMUSCULAR | Status: AC
  Administered 2013-08-20: 0.5 mL via INTRAMUSCULAR
  Filled 2013-08-19: qty 0.5

## 2013-08-19 NOTE — Progress Notes (Signed)
Nutrition Brief Note  Patient identified on the Malnutrition Screening Tool (MST) Report. Pt is currently eating well. She notes that she weighed 230 lb a few months ago and has been eating less at meals, however she states that she is happy with her weight loss and feels that she is getting adequate nutrition. Pt with no significant fat or muscle wasting.  Wt Readings from Last 15 Encounters:  08/18/13 205 lb 12.8 oz (93.35 kg)  12/24/11 232 lb (105.235 kg)    Body mass index is 32.72 kg/(m^2). Patient meets criteria for obese class II based on current BMI.   Current diet order is Regular, patient is consuming approximately 85% of meals at this time. Labs and medications reviewed.   No nutrition interventions warranted at this time. If nutrition issues arise, please consult RD.   Jarold MottoSamantha Fairley Copher MS, RD, LDN Inpatient Registered Dietitian Pager: [phone] After-hours pager: [phone]

## 2013-08-19 NOTE — Progress Notes (Signed)
   CARE MANAGEMENT NOTE 08/19/2013  Patient:  Christie Rogers, Christie Rogers   Account Number:  [id number]  Date Initiated:  08/19/2013  Documentation initiated by:  Lizabeth Leyden  Subjective/Objective Assessment:   admitted with community acquired pneumonia     Action/Plan:   medication assistance  PCP   Anticipated DC Date:  08/20/2013   Anticipated DC Plan:  [place]  CM consult      Choice offered to / List presented to:             Status of service:  In process, will continue to follow Medicare Important Message given?   (If response is "NO", the following Medicare IM given date fields will be blank) Date Medicare IM given:   Date Additional Medicare IM given:    Discharge Disposition:    Per UR Regulation:    If discussed at Long Length of Stay Meetings, dates discussed:    Comments:  08/19/2013  [place], [place] met with patient regarding medication assistance, advised her since rec'd [place] on 08/13/2013 she is not eligible at this time. She did use the [facility] letter to get her medications. [facility] 8:30. She talked with MD about getting BP meds on $4 list at [facility].  08/19/2013  [place], [place] CM referral: medication assistance  Met with patient regarding medication costs. She does not have any insurance and unable to afford her meds.  She goes to [facility] on [address], the last refills were $80, the BP meds are not on the $4 list. Suggested she speak with MD about other options for the BP meds.  Asked about her medical follow up. When she had insurance went to Dr Mingo Amber, cannot afford the co pay. Asked about the [facility]. She went there over a year ago.  [facility]  she noted there is  an appointment with the [facility] 4/21/@ 8:30  MATCH:  patient rec'd letter on 08/13/2013, not  eligible at this time.

## 2013-08-19 NOTE — Progress Notes (Signed)
UR completed. Patient changed to inpatient- requiring IV solumedrol  

## 2013-08-19 NOTE — Progress Notes (Signed)
Peak flow with good effort 160 pre 180 post

## 2013-08-19 NOTE — Progress Notes (Addendum)
Subjective: Patient seen and examined at the bedside this morning. She feels better this morning. Her shortness of breath has improved. The nebulizer treatments have helped, but she thinks she felt best after receiving the IV Levaquin. We did a peak flow with her, it was 150.  Objective: Vital signs in last 24 hours: Filed Vitals:   08/18/13 2116 08/19/13 0140 08/19/13 0427 08/19/13 0458  BP: 152/77   148/87  Pulse: 99 92 87 89  Temp: 98.1 F (36.7 C)   98.1 F (36.7 C)  TempSrc: Oral   Oral  Resp: 18 18 18 18   Height: 5' 6.5" (1.689 m)     Weight: 205 lb 12.8 oz (93.35 kg)     SpO2: 91%  95% 94%   Weight change:   Intake/Output Summary (Last 24 hours) at 08/19/13 0920 Last data filed at 08/19/13 0600  Gross per 24 hour  Intake   1270 ml  Output      0 ml  Net   1270 ml   Physical Exam  Constitutional: She is oriented to person, place, and time and well-developed, well-nourished, and in no distress.  Pleasant woman. Hoarse voice, slightly improved from yesterday. HENT:  Head: Normocephalic and atraumatic.  Mouth/Throat: Oropharynx is clear and moist and mucous membranes are normal. No oropharyngeal exudate, posterior oropharyngeal edema or posterior oropharyngeal erythema.  Eyes: Conjunctivae and EOM are normal. Pupils are equal, round, and reactive to light.  Neck: Normal range of motion. Neck supple. No JVD present. No tracheal deviation present. Thyromegaly (Goiter present, per patient this has been commented on by doctor's before) present.  Cardiovascular: Normal rate, regular rhythm, normal heart sounds and intact distal pulses. Exam reveals no gallop and no friction rub.  No murmur heard.  Pulmonary/Chest: No stridor. No respiratory distress. She has wheezes (Loud expiratory wheezes heard bilaterally and anteriorly). No rales. She exhibits no tenderness.  Abdominal: Soft. Bowel sounds are normal. She exhibits no distension. There is no tenderness.  Musculoskeletal:  Normal range of motion. She exhibits no edema and no tenderness.  Lymphadenopathy:  She has no cervical adenopathy.  Neurological: She is alert and oriented to person, place, and time. GCS score is 15.  Skin: Skin is warm and dry. No rash noted. She is not diaphoretic. No erythema. No pallor.  Psychiatric: Mood, memory, affect and judgment normal.   Lab Results: Basic Metabolic Panel:  Recent Labs Lab 08/18/13 0853 08/19/13 0106  NA 145 139  K 3.7 3.8  CL 104 104  CO2 28 20  GLUCOSE 107* 108*  BUN 25* 24*  CREATININE 1.06 0.96  CALCIUM 9.7 9.8   CBC:  Recent Labs Lab 08/13/13 0609 08/18/13 0853 08/19/13 0106  WBC 5.0 4.8 7.2  NEUTROABS  --  2.4  --   HGB 11.1* 11.5* 11.0*  HCT 32.5* 34.4* 32.0*  MCV 88.1 90.1 88.6  PLT 300 332 344   Cardiac Enzymes:  Recent Labs Lab 08/18/13 1334 08/18/13 2023 08/19/13 0106  TROPONINI <0.30 <0.30 <0.30   BNP:  Recent Labs Lab 08/13/13 0609 08/18/13 1446  PROBNP 139.2* 154.2*   Thyroid Function Tests:  Recent Labs Lab 08/18/13 1334 08/18/13 2023  TSH 0.069*  --   FREET4  --  1.73  T3FREE  --  2.9    Micro Results: Recent Results (from the past 240 hour(s))  CULTURE, BLOOD (ROUTINE X 2)     Status: None   Collection Time    08/18/13  3:22 PM  Result Value Ref Range Status   Specimen Description BLOOD RIGHT HAND   Final   Special Requests BOTTLES DRAWN AEROBIC AND ANAEROBIC 5CCS   Final   Culture  Setup Time     Final   Value: 08/18/2013 18:45     Performed at [facility]   Culture     Final   Value:        BLOOD CULTURE RECEIVED NO GROWTH TO DATE CULTURE WILL BE HELD FOR 5 DAYS BEFORE ISSUING A FINAL NEGATIVE REPORT     Performed at [facility]   Report Status PENDING   Incomplete  CULTURE, BLOOD (ROUTINE X 2)     Status: None   Collection Time    08/18/13  3:29 PM      Result Value Ref Range Status   Specimen Description BLOOD LEFT HAND   Final   Special Requests BOTTLES DRAWN  AEROBIC AND ANAEROBIC 5CC   Final   Culture  Setup Time     Final   Value: 08/18/2013 18:45     Performed at [facility]   Culture     Final   Value:        BLOOD CULTURE RECEIVED NO GROWTH TO DATE CULTURE WILL BE HELD FOR 5 DAYS BEFORE ISSUING A FINAL NEGATIVE REPORT     Performed at [facility]   Report Status PENDING   Incomplete   Studies/Results: Dg Chest 2 View  08/18/2013   CLINICAL DATA:  Shortness of breath, pneumonia, cough  EXAM: CHEST  2 VIEW  COMPARISON:  08/14/1998 50  FINDINGS: Slight improvement in the left upper lobe/lingula streaky airspace opacity compatible with pneumonia. Right lung remains clear. No developing effusion or pneumothorax. Normal heart size and vascularity. Trachea midline. Degenerative changes of the spine.  IMPRESSION: Improving left upper lobe/ lingula pneumonia. Recommend continued radiographic follow-up to document complete resolution.   Electronically Signed   By: Ruel Favorsrevor  Shick M.D.   On: 08/18/2013 08:32   Medications: I have reviewed the patient's current medications. Scheduled Meds: . heparin  5,000 Units Subcutaneous 3 times per day  . ipratropium-albuterol  3 mL Nebulization Q4H  . levofloxacin (LEVAQUIN) IV  750 mg Intravenous Q24H  . nebivolol  5 mg Oral Daily  . predniSONE  40 mg Oral Q breakfast  . sodium chloride  3 mL Intravenous Q12H   Continuous Infusions:  PRN Meds:.sodium chloride, guaiFENesin, sodium chloride  Assessment/Plan: Christie Rogers is a 63 y.o. woman PMH HTN who presents to the ED with a chief complaint of shortness of breath.   #Dyspnea - Patient presents with worsening dyspnea and expiratory wheezing on exam after completing an outpatient course of azithromycin for CAP. No history of prior respiratory diseases. AVSS and satting well on room air. She feels better this morning after scheduled DuoNebs and steroid treatment, but still has significant wheezing. This may have been due to airway irritation  provoked by a respiratory infection. Flu panel is negative. Respiratory virus panel still in process. HIV NR. Ischemic work up has been negative: EKG unremarkable and troponins negative. Pro-BNP mildly elevated at 154, [facility] about her history of an "enlarged heart." - Duonebs q4h scheduled  - Pre-and postbronchodilator peak flow measurements (128 > 165 overnight, have asked patient to tell RT to do this every time today) - Transition antibiotics to Levaquin 750mg  po daily  - Prednisone 40mg  daily - Respiratory virus panel > In  process - Sputum culture and gram stain > Pending - Blood cultures x2 > NGTD - Urine antigens > Strep negative, legionella pending - O2 prn  - Mucinex BID prn  - Obtain OSH records - Will need PFTs as outpatient - CBC in am   #HTN - 170/90 currently. Should be taking olmesartan-Amlodipine-HCTZ 40-10-25 mg once daily but patient cannot afford it.  - Continue home nebivolol 5mg  daily  - Adding amlodipine 5mg  daily - Can add additional meds (affordable) as needed   #Normocytic anemia - Hgb stable from prior labs. - Anemia panel  Hemoglobin  Date Value Ref Range Status  08/19/2013 11.0* 12.0 - 15.0 g/dL Final  8/41/32444/14/2015 01.011.5* 12.0 - 15.0 g/dL Final  2/7/25364/01/2014 64.411.1* 03.412.0 - 15.0 g/dL Final    #Goiter with subclinical hyperthyroidism - Per patient, this has been present for awhile and doctors often comment on it. TSH suppressed here at 0.068. Endorses weight loss, we have a weight from 2013 of 232lbs, today it is 225. However, free T4, T3, free T3 are all wnl. - She will need outpatient follow up of thyroid function  #DVT PPX - Heparin subq   Dispo: Disposition is deferred at this time, awaiting improvement of current medical problems.  Anticipated discharge in approximately 1-2 day(s).   The patient does have a current PCP Tobey Grim(Jeffrey H Walden, MD) and [facility] after  discharge.  The patient does not have transportation limitations that hinder transportation to clinic appointments.  .Services Needed at time of discharge: Y = Yes, Blank = No PT:   OT:   RN:   Equipment:   Other:     LOS: 1 day   Vivi BarrackSarah Takaya Hyslop, MD 08/19/2013, 9:20 AM   Vivi BarrackSarah Anyi Fels, MD  [email or web address] Pager # [phone] After hours and weekends # [phone] Office # [phone]

## 2013-08-19 NOTE — Progress Notes (Signed)
Internal Medicine Attending  Date: 08/19/2013  Patient name: Christie Rogers Medical record number: [id number] Date of birth: 10-31-1950 Age: 63 y.o. Gender: female  I saw and evaluated the patient. I discussed patient and reviewed the resident's note by Dr. Claudell Kyleater, and I agree with the resident's findings and plans as documented in her note, with the following additional comments.  Patient still has moderate diffuse expiratory wheezing; would change steroid from prednisone to IV Solu-Medrol today; continue inhaled bronchodilator regimen.  Dr. Dalphine HandingBhardwaj will take over as attending physician tomorrow 08/20/2013.

## 2013-08-19 NOTE — Progress Notes (Signed)
Peak Flow pre breathing treatment 180, post breathing treatment 160.

## 2013-08-19 NOTE — Progress Notes (Signed)
Peak flow Pre 175 Post 200 Good pt effort

## 2013-08-20 DIAGNOSIS — D649 Anemia, unspecified: Secondary | ICD-10-CM

## 2013-08-20 DIAGNOSIS — J45909 Unspecified asthma, uncomplicated: Secondary | ICD-10-CM

## 2013-08-20 LAB — CBC
HCT: 36.1 % (ref 36.0–46.0)
Hemoglobin: 12 g/dL (ref 12.0–15.0)
MCH: 29.7 pg (ref 26.0–34.0)
MCHC: 33.2 g/dL (ref 30.0–36.0)
MCV: 89.4 fL (ref 78.0–100.0)
Platelets: 364 10*3/uL (ref 150–400)
RBC: 4.04 MIL/uL (ref 3.87–5.11)
RDW: 13.5 % (ref 11.5–15.5)
WBC: 9.6 10*3/uL (ref 4.0–10.5)

## 2013-08-20 LAB — LEGIONELLA ANTIGEN, URINE: Legionella Antigen, Urine: NEGATIVE

## 2013-08-20 MED ORDER — LEVOFLOXACIN 750 MG PO TABS: 750.0000 mg | ORAL_TABLET | Freq: Every day | ORAL | Status: AC

## 2013-08-20 MED ORDER — LORATADINE 10 MG PO TABS: 10.0000 mg | ORAL_TABLET | Freq: Every day | ORAL | Status: AC

## 2013-08-20 MED ORDER — PREDNISONE 5 MG PO TABS: ORAL_TABLET | ORAL | Status: DC

## 2013-08-20 MED ORDER — BENZONATATE 100 MG PO CAPS
100.0000 mg | ORAL_CAPSULE | Freq: Three times a day (TID) | ORAL | Status: DC | PRN
  Filled 2013-08-20: qty 1

## 2013-08-20 MED ORDER — ALBUTEROL SULFATE (2.5 MG/3ML) 0.083% IN NEBU: 2.5000 mg | INHALATION_SOLUTION | RESPIRATORY_TRACT | Status: DC | PRN

## 2013-08-20 MED ORDER — HYDROCHLOROTHIAZIDE 25 MG PO TABS: 25.0000 mg | ORAL_TABLET | Freq: Every day | ORAL | Status: DC

## 2013-08-20 MED ORDER — BENZONATATE 100 MG PO CAPS: 100.0000 mg | ORAL_CAPSULE | Freq: Three times a day (TID) | ORAL | Status: AC | PRN

## 2013-08-20 MED ORDER — HYDROCHLOROTHIAZIDE 25 MG PO TABS
25.0000 mg | ORAL_TABLET | Freq: Every day | ORAL | Status: DC
  Filled 2013-08-20: qty 1

## 2013-08-20 MED ORDER — ALBUTEROL SULFATE HFA 108 (90 BASE) MCG/ACT IN AERS
1.0000 | INHALATION_SPRAY | RESPIRATORY_TRACT | Status: DC | PRN
  Filled 2013-08-20: qty 6.7

## 2013-08-20 NOTE — Progress Notes (Signed)
Subjective: Patient seen and examined at the bedside this morning. She feels much better. Denies shortness of breath, chest pain. Her hoarseness has improved. She feels ready to go home.  Objective: Vital signs in last 24 hours: Filed Vitals:   08/20/13 0017 08/20/13 0405 08/20/13 0500 08/20/13 0814  BP:   137/90   Pulse: 69 79 80 76  Temp:   97.6 F (36.4 C)   TempSrc:   Oral   Resp: 20 20 18 18   Height:      Weight:      SpO2: 95% 96% 95% 94%   Weight change:   Intake/Output Summary (Last 24 hours) at 08/20/13 0913 Last data filed at 08/20/13 0600  Gross per 24 hour  Intake    360 ml  Output      0 ml  Net    360 ml   Physical Exam  Constitutional: She is oriented to person, place, and time and well-developed, well-nourished, and in no distress.  Pleasant woman. Hoarse voice is much improved from yesterday. HENT:  Head: Normocephalic and atraumatic.  Mouth/Throat: Oropharynx is clear and moist and mucous membranes are normal. No oropharyngeal exudate, posterior oropharyngeal edema or posterior oropharyngeal erythema.  Eyes: Conjunctivae and EOM are normal. Pupils are equal, round, and reactive to light.  Neck: Normal range of motion. Neck supple. No JVD present. No tracheal deviation present. Thyromegaly (Goiter present, per patient this has been commented on by doctor's before) present.  Cardiovascular: Normal rate, regular rhythm, normal heart sounds and intact distal pulses. Exam reveals no gallop and no friction rub.  No murmur heard.  Pulmonary/Chest: No stridor. No respiratory distress. She has wheezes (expiratory wheezes still heard bilaterally and anteriorly, but improved). No rales. She exhibits no tenderness.  Abdominal: Soft. Bowel sounds are normal. She exhibits no distension. There is no tenderness.  Musculoskeletal: Normal range of motion. She exhibits no edema and no tenderness.  Lymphadenopathy:  She has no cervical adenopathy.  Neurological: She is  alert and oriented to person, place, and time. GCS score is 15.  Skin: Skin is warm and dry. No rash noted. She is not diaphoretic. No erythema. No pallor.  Psychiatric: Mood, memory, affect and judgment normal.   Lab Results: Basic Metabolic Panel:  Recent Labs Lab 08/18/13 0853 08/19/13 0106  NA 145 139  K 3.7 3.8  CL 104 104  CO2 28 20  GLUCOSE 107* 108*  BUN 25* 24*  CREATININE 1.06 0.96  CALCIUM 9.7 9.8   CBC:  Recent Labs Lab 08/18/13 0853 08/19/13 0106 08/20/13 0511  WBC 4.8 7.2 9.6  NEUTROABS 2.4  --   --   HGB 11.5* 11.0* 12.0  HCT 34.4* 32.0* 36.1  MCV 90.1 88.6 89.4  PLT 332 344 364   Cardiac Enzymes:  Recent Labs Lab 08/18/13 1334 08/18/13 2023 08/19/13 0106  TROPONINI <0.30 <0.30 <0.30   BNP:  Recent Labs Lab 08/18/13 1446  PROBNP 154.2*   Thyroid Function Tests:  Recent Labs Lab 08/18/13 1334 08/18/13 2023  TSH 0.069*  --   FREET4  --  1.73  T3FREE  --  2.9    Micro Results: Recent Results (from the past 240 hour(s))  RESPIRATORY VIRUS PANEL     Status: None   Collection Time    08/18/13  2:14 PM      Result Value Ref Range Status   Source - RVPAN NASAL SWAB   Corrected   Comment: CORRECTED ON 04/15 AT 2053: PREVIOUSLY  REPORTED AS NASAL SWAB   Respiratory Syncytial Virus A NOT DETECTED   Final   Respiratory Syncytial Virus B NOT DETECTED   Final   Influenza A NOT DETECTED   Final   Influenza B NOT DETECTED   Final   Parainfluenza 1 NOT DETECTED   Final   Parainfluenza 2 NOT DETECTED   Final   Parainfluenza 3 NOT DETECTED   Final   Metapneumovirus NOT DETECTED   Final   Rhinovirus NOT DETECTED   Final   Adenovirus NOT DETECTED   Final   Influenza A H1 NOT DETECTED   Final   Influenza A H3 NOT DETECTED   Final   Comment: (NOTE)           Normal Reference Range for each Analyte: NOT DETECTED     Testing performed using the Luminex xTAG Respiratory Viral Panel test     kit.     This test was developed and its performance  characteristics determined     by [facility]. It has not been cleared or approved by the [place]     Food and Drug Administration. This test is used for clinical purposes.     It should not be regarded as investigational or for research. This     laboratory is certified under the [place] (CLIA) as qualified to perform high complexity     clinical laboratory testing.     Performed at [place], BLOOD (ROUTINE X 2)     Status: None   Collection Time    08/18/13  3:22 PM      Result Value Ref Range Status   Specimen Description BLOOD RIGHT HAND   Final   Special Requests BOTTLES DRAWN AEROBIC AND ANAEROBIC 5CCS   Final   Culture  Setup Time     Final   Value: 08/18/2013 18:45     Performed at [facility]   Culture     Final   Value:        BLOOD CULTURE RECEIVED NO GROWTH TO DATE CULTURE WILL BE HELD FOR 5 DAYS BEFORE ISSUING A FINAL NEGATIVE REPORT     Performed at [facility]   Report Status PENDING   Incomplete  CULTURE, BLOOD (ROUTINE X 2)     Status: None   Collection Time    08/18/13  3:29 PM      Result Value Ref Range Status   Specimen Description BLOOD LEFT HAND   Final   Special Requests BOTTLES DRAWN AEROBIC AND ANAEROBIC 5CC   Final   Culture  Setup Time     Final   Value: 08/18/2013 18:45     Performed at [facility]   Culture     Final   Value:        BLOOD CULTURE RECEIVED NO GROWTH TO DATE CULTURE WILL BE HELD FOR 5 DAYS BEFORE ISSUING A FINAL NEGATIVE REPORT     Performed at [facility]   Report Status PENDING   Incomplete   Studies/Results: No results found. Medications: I have reviewed the patient's current medications. Scheduled Meds: . amLODipine  10 mg Oral Daily  . heparin  5,000 Units Subcutaneous 3 times per day  . ipratropium-albuterol  3 mL Nebulization Q4H  . levofloxacin  750 mg Oral Daily  . methylPREDNISolone (SOLU-MEDROL) injection  60 mg  Intravenous Q12H  . pneumococcal 23 valent vaccine  0.5 mL Intramuscular Tomorrow-1000  . sodium chloride  3 mL Intravenous Q12H   Continuous Infusions:  PRN Meds:.sodium chloride, guaiFENesin, sodium chloride  Assessment/Plan: Christie Rogers is a 63 y.o. woman PMH HTN who presents to the ED with a chief complaint of shortness of breath.   #Dyspnea - AVSS and satting well on room air. She feels better this morning after scheduled DuoNebs and steroid treatment. She still has some wheezing and peak flows are 120-200s. Respiratory virus panel was negative. She has a family history of adult-onset asthma. We obtained records from [facility], they show an admission from 09/2007 for atypical chest pain, serial cardiac enzymes were trended and cardiology was consulted, D-dimer was negative, recommend outpatient stress test. Also an admission from 06/2010 for atypical chest pain through related to GERD. It mentions a history of a normal cath in 2004. I also see normal CXR studies obtained for SOB in 06/2008 and 10/2008 and 06/2010. There is a 2D echo from 06/2007 showing hyperdynamic left ventricle with concentric hypertrophy, reduced compliance, and EF 75%, mild left atrial enlargement. - Duonebs q4h scheduled - Continue pre-and postbronchodilator peak flow measurements - Levaquin 739m po daily  - Solu-Medrol 671mIV BID - Transition steroids to Prednisone upon discharge - Sputum culture and gram stain > Pending - Blood cultures x2 > NGTD - Urine antigens > Strep negative, legionella pending - O2 prn  - Mucinex BID prn  - Tessalon TID prn - Will need PFTs as outpatient - Medically stable for discharge today  #HTN - Normotensive currently. Should be taking olmesartan-Amlodipine-HCTZ 40-10-25 mg once daily but patient cannot afford it.  - Holding home nebivolol 79m84maily given possible asthma - Stop amlodipine 84m24mily - Add HCTZ 279mg779mly (on [facility] list)  #Financial difficulties:  [facility], [facility] I have spoken with the pharmacy and they will give the patient an Albuterol MDI to take home.  #Normocytic anemia - Hgb stable. Anemia panel unremarkable. - Continue to monitor  Hemoglobin  Date Value Ref Range Status  08/20/2013 12.0  12.0 - 15.0 g/dL Final  08/19/2013 11.0* 12.0 - 15.0 g/dL Final  08/18/2013 11.5* 12.0 - 15.0 g/dL Final   Iron/TIBC/Ferritin    Component Value Date/Time   IRON 60 08/19/2013 0840   TIBC 286 08/19/2013 0840   FERRITIN 50 08/19/2013 0840    #Goiter with subclinical hyperthyroidism vs. Sick euthyroid - Per patient, this has been present for awhile and doctors often comment on it. TSH suppressed here at 0.068. Endorses weight loss, we have a weight from 2013 of 232lbs, today it is 225. However, free T4, T3, free T3 are all wnl. - She will need outpatient follow up of thyroid function  #DVT PPX - Heparin subq   Dispo: Anticipated discharge in approximately 0-1 day(s).   The patient does have a current PCP (JeffAlveda Reasons and [facility].  The patient does not have transportation limitations that hinder transportation to clinic appointments.  .Services Needed at time of discharge: Y = Yes, Blank = No PT:   OT:   RN:   Equipment:   Other:     LOS: 2 days   SarahLesly Dukes4/16/2015, 9:13 AM   SarahLesly Dukes [email or web address] Pager # [phone] hours and weekends # [phone] # [phone]

## 2013-08-20 NOTE — Progress Notes (Signed)
Peak Flow 120 pre, 140 post breathing treatment.  Good patient effort.

## 2013-08-20 NOTE — Discharge Summary (Signed)
Name: Christie Rogers MRN: [id number] DOB: May 18, 1950 63 y.o. PCP: Christie GrimJeffrey H Walden, MD  Date of Admission: 08/18/2013  7:33 AM Date of Discharge: 08/20/2013 Attending Physician: Aletta EdouardShilpa Bhardwaj, MD  Discharge Diagnosis: Principal Problem:   Reactive airway disease with wheezing Active Problems:   CAP (community acquired pneumonia)   Hyperthyroidism  Discharge Medications:   Medication List    STOP taking these medications       azithromycin 250 MG tablet  Commonly known as:  ZITHROMAX     fexofenadine-pseudoephedrine 60-120 MG per tablet  Commonly known as:  ALLEGRA-D     nebivolol 5 MG tablet  Commonly known as:  BYSTOLIC     TRIBENZOR 40-10-25 MG Tabs  Generic drug:  Olmesartan-Amlodipine-HCTZ      TAKE these medications       acetaminophen 500 MG tablet  Commonly known as:  TYLENOL  Take 1,000 mg by mouth every 6 (six) hours as needed for moderate pain.     albuterol 108 (90 BASE) MCG/ACT inhaler  Commonly known as:  PROVENTIL HFA;VENTOLIN HFA  Inhale 1-2 puffs into the lungs every 6 (six) hours as needed for wheezing.     benzonatate 100 MG capsule  Commonly known as:  TESSALON  Take 1 capsule (100 mg total) by mouth 3 (three) times daily as needed for cough.     hydrochlorothiazide 25 MG tablet  Commonly known as:  HYDRODIURIL  Take 1 tablet (25 mg total) by mouth daily.     levofloxacin 750 MG tablet  Commonly known as:  LEVAQUIN  Take 1 tablet (750 mg total) by mouth daily.     loratadine 10 MG tablet  Commonly known as:  CLARITIN  Take 1 tablet (10 mg total) by mouth daily.     MUCINEX 600 MG 12 hr tablet  Generic drug:  guaiFENesin  Take 600 mg by mouth 2 (two) times daily as needed for to loosen phlegm.     predniSONE 5 MG tablet  Commonly known as:  DELTASONE  Please take 60mg  (12 tabs) with breakfast for 3 days, 40mg  (8 tabs for 3 days, 20mg  (4 tabs) for 3 days, then 10mg  (2 tabs) for 3 days        Disposition and follow-up:     [name] was [facility] in Stable condition.  At the hospital follow up visit please address:  1.  Insurance status. Wheezing/SOB? BP control? Consider adding Asthma maintenance meds (e.g. Dulera) if she can afford them.  2.  Labs / imaging needed at time of follow-up: PFTs in 4-6 weeks  3.  Pending labs/ test needing follow-up: None  Follow-up Appointments: Follow-up Information   [facility] On 08/25/2013. (@8 :30am for hospital follow up)    Specialty:  Family Medicine   Contact information:   [address] [place] [address] [phone]       Discharge Instructions: Discharge Orders   Future Appointments Provider Department Dept Phone   08/25/2013 8:30 AM Christie GrimJeffrey H Walden, MD [facility] [phone]   Future Orders Complete By Expires   Call MD for:  difficulty breathing, headache or visual disturbances  As directed    Diet - low sodium heart healthy  As directed    Increase activity slowly  As directed       Consultations:  None  Procedures Performed:  Dg Chest 2 View  08/18/2013   CLINICAL DATA:  Shortness of  breath, pneumonia, cough  EXAM: CHEST  2 VIEW  COMPARISON:  08/14/1998 50  FINDINGS: Slight improvement in the left upper lobe/lingula streaky airspace opacity compatible with pneumonia. Right lung remains clear. No developing effusion or pneumothorax. Normal heart size and vascularity. Trachea midline. Degenerative changes of the spine.  IMPRESSION: Improving left upper lobe/ lingula pneumonia. Recommend continued radiographic follow-up to document complete resolution.   Electronically Signed   By: Ruel Favors M.D.   On: 08/18/2013 08:32   Dg Chest 2 View  08/13/2013   CLINICAL DATA:  63 year old female with chest pain cough and shortness of Breath. Initial encounter.  EXAM: CHEST  2 VIEW  COMPARISON:  None.  FINDINGS: Mildly low lung volumes. Streaky and confluent  anterior inferior left upper lobe opacity. No pleural effusion, pneumothorax or pulmonary edema. Tortuous thoracic aorta. Other mediastinal contours are within normal limits. Visualized tracheal air column is within normal limits. No acute osseous abnormality identified.  IMPRESSION: Confluent left upper lobe opacity, nonspecific but commonly due to bronchopneumonia. Clinical correlation recommended.   Electronically Signed   By: Augusto Gamble M.D.   On: 08/13/2013 07:09    Admission HPI:  Christie Rogers is a 63 y.o. woman PMH HTN who presents to the ED with a chief complaint of shortness of breath.   Patient was seen in the ED on April 9 for shortness of breath and productive cough. CXR showed LUL consolidation suggestive of bronchopneumonia, so she was diagnosed with CAP and discharged home on Z-Pak. She had some wheezing and chest pain (sternal radiating to back) at that visit, which resolved with a DuoNeb treatment and Tylenol. EKG was NSR, no acute ischemic changes, and troponin was negative x1. She has no history of asthma or COPD, and she is not a smoker.   Her symptoms improved for about 2 days on the antibiotics, but then worsened. She has dyspnea worse on exertion, wheezing, sensation of not getting enough air, hoarse voice, and a continued cough productive of a small amount of sputum. Denies orthopnea, PND, leg swelling, weight gain (says she has lost weight). Denies chest pain since her ED visit on 4/9, weakness or numbness, abdominal pain, nausea, vomiting. She had a sore throat prior to her 4/9 ED visit but none currently. Has not noted cervical lymphadenopathy.   She tells [place] she had a cardiac work-up a few years ago at [facility] in [place], around 2010. She had a 2D echo that showed an "enlarged heart" as well as a catheterization that she thinks was normal. She doesn't think she takes any medicines for heart failure or heart attack, just HTN. She is on a beta-blocker at home,  Nebivolol. She does not take a daily aspirin.   She has a family history of lung cancer in her mother and father. Her mother also developed asthma late in life. Hypertension runs in her family. 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  In the ED she was given DuoNebs x2, Solu-Medrol 125mg  IV x1, IV Levaquin, and IVF NS @100cc /hr.  Physical Exam:  Blood pressure 148/89, pulse 81, temperature 97.8 F (36.6 C), temperature source Oral, resp. rate 18, height 5' 6.5" (1.689 m), weight 225 lb (102.059 kg), SpO2 95.00%.  Physical Exam  Constitutional: She is oriented to person, place, and time and well-developed, well-nourished, and in no distress.  Pleasant woman. Hoarse voice.  HENT:  Head: Normocephalic and atraumatic.  Mouth/Throat: Oropharynx is clear and moist  and mucous membranes are normal. No oropharyngeal exudate, posterior oropharyngeal edema or posterior oropharyngeal erythema.  Eyes: Conjunctivae and EOM are normal. Pupils are equal, round, and reactive to light.  Neck: Normal range of motion. Neck supple. No JVD present. No tracheal deviation present. Thyromegaly (Goiter present, per patient this has been commented on by doctor's before) present.  Cardiovascular: Normal rate, regular rhythm, normal heart sounds and intact distal pulses. Exam reveals no gallop and no friction rub.  No murmur heard.  Pulmonary/Chest: No stridor. No respiratory distress. She has wheezes (Loud expiratory wheezes heard bilaterally and anteriorly). She has rales (Soft crackles left lung). She exhibits no tenderness.  Abdominal: Soft. Bowel sounds are normal. She exhibits no distension. There is no tenderness.  Musculoskeletal: Normal range of motion. She exhibits no edema and no tenderness.  Lymphadenopathy:  She has no cervical adenopathy.  Neurological: She is alert and oriented to person, place, and time. GCS score is 15.  Skin: Skin is warm and dry. No rash  noted. She is not diaphoretic. No erythema. No pallor.  Psychiatric: Mood, memory, affect and judgment normal.    Hospital Course by problem list: Christie Rogers is a 63 y.o. woman PMH HTN who presents to the ED with a chief complaint of shortness of breath.   1. Reactive airway disease with wheezing - Patient presented with worsening dyspnea and expiratory wheezing on exam after completing an outpatient course of azithromycin for CAP. CXR showed improving left upper lobe/ lingula pneumonia. She was without fever or leukocytosis and had no left shift. No history of prior respiratory diseases. Non-smoker. AVSS and she was satting well on room air. She has a family history of adult-onset asthma and reported seasonal allergies. Symptoms subjectively improved with scheduled DuoNebs, IV Solu-Medrol, and IV Levaquin, though she still had some wheezing and peak flows were 120-200s. Respiratory virus panel was negative. She was discharged with an Albuterol inhaler (provided by pharmacy), steroid taper, and the last 2 days of a 5 day course of Levaquin 750mg  po. We prescribed Tessalon perles for cough and Claritin for allergies (both on [facility] list). She will need PFTs as an outpatient in 4-6 weeks to confirm diagnosis of asthma/reactive airway disease.  2. HTN - Only takes nebivolol 5mg  daily at home. Should also be taking olmesartan-Amlodipine-HCTZ 40-10-25 mg once daily but patient cannot afford it. BP elevated on admission, so we started amlodipine 10mg  daily. We held her home nebivolol 5mg  daily given possible asthma. On discharge we converted her to HCTZ 25mg  daily since this is on the [employer] list. We recommended she stop the beta-blocker. Please follow up BP control.  3. Financial difficulties: She is not eligible for [facility] letter as she got one on 4/9, so I chose her discharge meds from the [facility] list. The only one not on the list is Levaquin. I spoke with the pharmacy and they gave the patient  an Albuterol MDI to take home.  4. Normocytic anemia - Anemia panel unremarkable. See below.  Hemoglobin   Date  Value  Ref Range  Status   08/20/2013  12.0  12.0 - 15.0 g/dL  Final   1/61/0960  45.4*  12.0 - 15.0 g/dL  Final   0/98/1191  47.8*  12.0 - 15.0 g/dL  Final    Iron/TIBC/Ferritin    Component  Value  Date/Time    IRON  60  08/19/2013 0840    TIBC  286  08/19/2013 0840    FERRITIN  50  08/19/2013 0840     5. 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 TSH suppressed here at 0.068. Endorsed weight loss, we have a weight from 2013 of 232lbs, today it is 225. However, free T4, T3, free T3 are all wnl. She will need outpatient follow up of thyroid function.  6. History of "enlarged heart" - Patient self reported this history on admission. [facility] [place], they show an admission from 09/2007 for atypical chest pain, serial cardiac enzymes were trended and cardiology was consulted, D-dimer was negative, recommend outpatient stress test. Also an admission from 06/2010 for atypical chest pain through related to GERD. It mentions a history of a normal cath in 2004. I also see normal CXR studies obtained for SOB in 06/2008 and 10/2008 and 06/2010. There is a 2D echo from 06/2007 showing hyperdynamic left ventricle with concentric hypertrophy, reduced compliance, and EF 75%, mild left atrial enlargement.   Discharge Vitals:   BP 118/74  Pulse 82  Temp(Src) 97.6 F (36.4 C) (Oral)  Resp 18  Ht 5' 6.5" (1.689 m)  Wt 205 lb 12.8 oz (93.35 kg)  BMI 32.72 kg/m2  SpO2 98%  Discharge Labs:  Results for orders placed during the hospital encounter of 08/18/13 (from the past 24 hour(s))  CBC     Status: None   Collection Time    08/20/13  5:11 AM      Result Value Ref Range   WBC 9.6  4.0 - 10.5 K/uL   RBC 4.04  3.87 - 5.11 MIL/uL   Hemoglobin 12.0   12.0 - 15.0 g/dL   HCT 16.136.1  09.636.0 - 04.546.0 %   MCV 89.4  78.0 - 100.0 fL   MCH 29.7  26.0 - 34.0 pg   MCHC 33.2  30.0 - 36.0 g/dL   RDW 40.913.5  81.111.5 - 91.415.5 %   Platelets 364  150 - 400 K/uL    Signed: Vivi BarrackSarah Donley Harland, MD 08/20/2013, 12:05 PM   Time Spent on Discharge: 35 minutes Services Ordered on Discharge: None Equipment Ordered on Discharge: None

## 2013-08-20 NOTE — Discharge Summary (Signed)
INTERNAL MEDICINE ATTENDING DISCHARGE COSIGN   I evaluated the patient on the day of discharge and discussed the discharge plan with my resident team. I agree with the discharge documentation and disposition.   Jansel Vonstein 08/20/2013, 3:07 PM

## 2013-08-20 NOTE — Progress Notes (Signed)
Peak flow pre breathing treatment 160, post treatment 170.  Good patient effort.

## 2013-08-20 NOTE — Progress Notes (Signed)
Pt is being discharged home. Pt has been provided with discharge instructions. RN went over instructions with the pt and answered all questions the pt had  

## 2013-08-20 NOTE — Discharge Instructions (Signed)
It was a pleasure taking care of you. - Please follow up with your PCP as above.  - We would like you to talk to him about obtaining health insurance and how best to afford your medicines. - Your symptoms are likely due to asthma, this may have been triggered by allergies. - In the meantime, we have gotten you an Albuterol inhaler to take home. Please take 1-2 puffs every 4 hours as needed for shortness of breath. - We have prescribed you a blood pressure medicine called HCTZ off the [facility] $4 list. - We have also prescribed a steroid taper, please take this as instructed to treat your asthma. - Tessalon is a cough medicine and it is available [facility]4 at [facility]. - The only medication that will not be $4 is your antibiotic, Levaquin. Unfortunately we cannot get the pills from here to send you home with, and we cannot get [facility] because you just used one.  - Please use your new peak flow meter to measure your breaths at home, and write down the levels. Bring this to your doctor's appointment on 4/21. - We have prescribed Clariton for allergies as well, [facility]. - Please stop taking Nebivolol as this medicine can worsen asthma. - If you develop worsening shortness of breath, chest pain, fever, please call your doctor at [phone] or return to the ED.

## 2013-08-24 LAB — CULTURE, BLOOD (ROUTINE X 2)
CULTURE: NO GROWTH
Culture: NO GROWTH

## 2013-08-25 ENCOUNTER — Ambulatory Visit (INDEPENDENT_AMBULATORY_CARE_PROVIDER_SITE_OTHER): Payer: Self-pay | Admitting: Family Medicine

## 2013-08-25 ENCOUNTER — Encounter: Payer: Self-pay | Admitting: Family Medicine

## 2013-08-25 VITALS — BP 130/95 | HR 85 | Temp 97.9°F | Ht 66.5 in | Wt 199.1 lb

## 2013-08-25 DIAGNOSIS — J45909 Unspecified asthma, uncomplicated: Secondary | ICD-10-CM

## 2013-08-25 DIAGNOSIS — J189 Pneumonia, unspecified organism: Secondary | ICD-10-CM

## 2013-08-25 MED ORDER — IPRATROPIUM BROMIDE 0.02 % IN SOLN
0.5000 mg | Freq: Once | RESPIRATORY_TRACT | Status: AC
  Administered 2013-08-25: 0.5 mg via RESPIRATORY_TRACT

## 2013-08-25 MED ORDER — PREDNISONE 5 MG PO TABS: ORAL_TABLET | ORAL | Status: AC

## 2013-08-25 MED ORDER — ALBUTEROL SULFATE (2.5 MG/3ML) 0.083% IN NEBU
2.5000 mg | INHALATION_SOLUTION | Freq: Once | RESPIRATORY_TRACT | Status: AC
  Administered 2013-08-25: 2.5 mg via RESPIRATORY_TRACT

## 2013-08-25 NOTE — Progress Notes (Signed)
Subjective:    Christie Rogers is a 63 y.o. female who [facility] hospital FU:  1.  Hospital FU:  Admitted for CAP last week.  Seen at ED 1 week prior, prescribed azithro.  Still with cough and some fever at home, presented to ED again and admitted 4/14.  Discharged home on 4/16.    Better since DC though still with cough, worse at night.  Has used albuterol twice since DC.  No fevers or chills at home.  Cough is dry and hacking.  Keeps her awake.  STill not at her usual level of activity.  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  Has finished her Levaquin.  Could not afford the steroids with which she was discharged.    Denies chest pain, night sweats, palpitations, abdomen pain, N/V.  States she can walk about 100 yards before tiring, which is not usual for her.     ROS as above per HPI, otherwise neg.    The following portions of the patient's history were reviewed and updated as appropriate: allergies, current medications, past medical history, family and social history, and problem list. Patient is a nonsmoker.    PMH reviewed.  Past Medical History  Diagnosis Date  . Hypertension    Past Surgical History  Procedure Laterality Date  . Tubal ligation  1987  . Cardiac catheterization  2000    "Clean cath" according to patient - August 2013.  [place]    Medications reviewed. Current Outpatient Prescriptions  Medication Sig Dispense Refill  . acetaminophen (TYLENOL) 500 MG tablet Take 1,000 mg by mouth every 6 (six) hours as needed for moderate pain.      Marland Kitchen. albuterol (PROVENTIL HFA;VENTOLIN HFA) 108 (90 BASE) MCG/ACT inhaler Inhale 1-2 puffs into the lungs every 6 (six) hours as needed for wheezing.  1 Inhaler  0  . benzonatate (TESSALON) 100 MG capsule Take 1 capsule (100 mg total) by mouth 3 (three) times daily as needed for cough.  20 capsule  0  . guaiFENesin (MUCINEX) 600 MG 12 hr tablet  Take 600 mg by mouth 2 (two) times daily as needed for to loosen phlegm.       . hydrochlorothiazide (HYDRODIURIL) 25 MG tablet Take 1 tablet (25 mg total) by mouth daily.  30 tablet  1  . loratadine (CLARITIN) 10 MG tablet Take 1 tablet (10 mg total) by mouth daily.  30 tablet  1  . predniSONE (DELTASONE) 5 MG tablet Please take 60mg  (12 tabs) with breakfast for 3 days, 40mg  (8 tabs for 3 days, 20mg  (4 tabs) for 3 days, then 10mg  (2 tabs) for 3 days  78 tablet  0   No current facility-administered medications for this visit.     Objective:   Physical Exam There were no vitals taken for this visit. Gen:  Alert, cooperative patient who appears stated age in no acute distress.  Vital signs reviewed. HEENT: EOMI,  MMM.  Arcus senilis present BL Neck:  THryoid midline Cardiac:  Regular rate and rhythm  Pulm:  Diffuse wheezing throughout.  Coarse lung sounds at bases BL Exts: Non edematous BL  LE, warm and well perfused.   No results found for this or any previous visit (from the past 72 hour(s)).

## 2013-08-25 NOTE — Assessment & Plan Note (Signed)
Improved, still with cough (see RAD) No further signs of infection No need further antibiotics.

## 2013-08-25 NOTE — Assessment & Plan Note (Addendum)
Still wheezing - main issue today.  Nebulizer treatment Albuterol/atrovent in clinic.  Lung exam actually with worsened wheezing/rhonchi after exam [facility].   See insturctions for use of albuterol.  Scheduled usage this week Needs prednisone Prescribe 5 mg tabs as these are $4 at [facility]. FU in 1 weeks to assess for improvement.   Will also need PFTs and repeat CXR 6 weeks after DC

## 2013-08-25 NOTE — Patient Instructions (Signed)
REst your voice as much as you can to help with the hoarseness.  Take the steroids 4 pills a day for 5 days until gone.  Come back to see us next week if you're not much better, or come back to see me in 2 weeks.    Take the inhaler every 6-8 hours scheduled and before bed.  You can back off to as needed come Friday but still use before bed.

## 2013-09-01 ENCOUNTER — Ambulatory Visit: Payer: Self-pay

## 2013-09-10 ENCOUNTER — Ambulatory Visit (INDEPENDENT_AMBULATORY_CARE_PROVIDER_SITE_OTHER): Payer: Self-pay | Admitting: Family Medicine

## 2013-09-10 ENCOUNTER — Ambulatory Visit (HOSPITAL_COMMUNITY)
Admission: RE | Admit: 2013-09-10 | Discharge: 2013-09-10 | Disposition: A | Discharge status: Home / Self Care | Payer: Self-pay | Source: Ambulatory Visit | Attending: Family Medicine | Admitting: Family Medicine

## 2013-09-10 ENCOUNTER — Telehealth: Payer: Self-pay | Admitting: Family Medicine

## 2013-09-10 ENCOUNTER — Encounter: Payer: Self-pay | Admitting: Family Medicine

## 2013-09-10 VITALS — BP 126/82 | HR 88 | Temp 98.8°F | Ht 66.5 in | Wt 202.5 lb

## 2013-09-10 DIAGNOSIS — M7989 Other specified soft tissue disorders: Secondary | ICD-10-CM

## 2013-09-10 DIAGNOSIS — M79609 Pain in unspecified limb: Secondary | ICD-10-CM

## 2013-09-10 DIAGNOSIS — J189 Pneumonia, unspecified organism: Secondary | ICD-10-CM | POA: Insufficient documentation

## 2013-09-10 DIAGNOSIS — J45909 Unspecified asthma, uncomplicated: Secondary | ICD-10-CM

## 2013-09-10 DIAGNOSIS — R609 Edema, unspecified: Secondary | ICD-10-CM

## 2013-09-10 DIAGNOSIS — R0602 Shortness of breath: Secondary | ICD-10-CM | POA: Insufficient documentation

## 2013-09-10 DIAGNOSIS — E0590 Thyrotoxicosis, unspecified without thyrotoxic crisis or storm: Secondary | ICD-10-CM

## 2013-09-10 DIAGNOSIS — R600 Localized edema: Secondary | ICD-10-CM

## 2013-09-10 DIAGNOSIS — Z0000 Encounter for general adult medical examination without abnormal findings: Secondary | ICD-10-CM

## 2013-09-10 MED ORDER — IBUPROFEN 600 MG PO TABS: 600.0000 mg | ORAL_TABLET | Freq: Three times a day (TID) | ORAL | Status: AC | PRN

## 2013-09-10 NOTE — Assessment & Plan Note (Signed)
Currently overdue for Pap smear, colonoscopy, mammogram.  Without any insurance, waiting until she can obtain the [employer] Card so she's not stick with large bill.  .Marland Kitchen

## 2013-09-10 NOTE — Progress Notes (Signed)
Left lower extremity venous duplex completed.  Left:  No evidence of DVT, superficial thrombosis, or Baker's cyst.  Right:  Negative for DVT in the common femoral vein.  

## 2013-09-10 NOTE — Telephone Encounter (Signed)
Called by sonographer.  Doppler negative for DVT.  I personally spoke with patient, possibility of muscle strain with inflammation/swelling.  Send in Ibuprofen for relief and anti-inflammatory.  FU in 2 weeks to assess for improvement.  Check TSH at that time.

## 2013-09-10 NOTE — Progress Notes (Signed)
Subjective:    Christie Rogers is a 63 y.o. female [facility]:  1.  FU for PNA:  Vastly improved once started inhaler at home and steroids.  SOB lasted about 2-3 days after last OV then resolved.  No fevers or chills.  Has not had to use her inhaler at all this week.  2.  LE edema:  Present both legs but left was worse and painful.  Started on Sunday.  Rigth side resolved next day but left side worsened.  Pain in calf, moderate to severe by end of the working day.  No shortness of breath.  Some redness in calf noted.     Prev health:  Currently overdue for Pap smear, colonoscopy, mammogram.  Without any insurance, [facility] so she's not stick with large bill.  .  ROS as above per HPI, otherwise neg.  Pertinently, no chest pain, palpitations, SOB, Fever, Chills, Abd pain, N/V/D.   The following portions of the patient's history were reviewed and updated as appropriate: allergies, current medications, past medical history, family and social history, and problem list. Patient is a nonsmoker.    PMH reviewed.  Past Medical History  Diagnosis Date  . Hypertension    Past Surgical History  Procedure Laterality Date  . Tubal ligation  1987  . Cardiac catheterization  2000    "Clean cath" according to patient - August 2013.  [place]    Medications reviewed. Current Outpatient Prescriptions  Medication Sig Dispense Refill  . acetaminophen (TYLENOL) 500 MG tablet Take 1,000 mg by mouth every 6 (six) hours as needed for moderate pain.      Marland Kitchen. albuterol (PROVENTIL HFA;VENTOLIN HFA) 108 (90 BASE) MCG/ACT inhaler Inhale 1-2 puffs into the lungs every 6 (six) hours as needed for wheezing.  1 Inhaler  0  . benzonatate (TESSALON) 100 MG capsule Take 1 capsule (100 mg total) by mouth 3 (three) times daily as needed for cough.  20 capsule  0  . guaiFENesin (MUCINEX) 600 MG 12 hr tablet Take 600 mg by mouth 2 (two) times  daily as needed for to loosen phlegm.       . hydrochlorothiazide (HYDRODIURIL) 25 MG tablet Take 1 tablet (25 mg total) by mouth daily.  30 tablet  1  . loratadine (CLARITIN) 10 MG tablet Take 1 tablet (10 mg total) by mouth daily.  30 tablet  1  . predniSONE (DELTASONE) 5 MG tablet Take 4 tabs po daily (20 mg) x 5 days  20 tablet  0   No current facility-administered medications for this visit.     Objective:   Physical Exam BP 126/82  Pulse 88  Temp(Src) 98.8 F (37.1 C) (Oral)  Ht 5' 6.5" (1.689 m)  Wt 202 lb 8 oz (91.853 kg)  BMI 32.20 kg/m2  SpO2 99% Gen:  Alert, cooperative patient who appears stated age in no acute distress.  Vital signs reviewed. HEENT: EOMI,  MMM.  Poor dentition.  Cardiac:  Regular rate and rhythm  Pulm:  Clear to auscultation bilaterally with good air movement.  No wheezes or rales noted.   Exts: Right LE with trace edema at ankle.  LLE with +2 edema to mid-shin.  TTP throughout calf.  Left calf is 2 cm in diameter thicker than Right.    No results found for this or any previous visit (from the past 72 hour(s)).

## 2013-09-10 NOTE — Assessment & Plan Note (Signed)
I am concerned for DVT on left. Sending for stat doppler. May need admission for heparin to coumadin bridge due to finances.   Will call patient with results.

## 2013-09-10 NOTE — Patient Instructions (Signed)
[facility]  I will call you with the results.

## 2013-09-10 NOTE — Assessment & Plan Note (Signed)
TSH low while in-house. Needs repeat but cannot afford currently. No signs or symptoms of hyperthyroidism.

## 2013-09-10 NOTE — Assessment & Plan Note (Signed)
Currently resolved.  No need for inhalers.  Would benefit from PFTs

## 2013-09-10 NOTE — Assessment & Plan Note (Signed)
Greatly improved. Recommended radiology FU -- waiting for insurance before obtaining this as she has so completely cleared.

## 2013-09-11 NOTE — Telephone Encounter (Signed)
Would you guys mind calling Ms Allie BossierLawton to set up an appt with me in the next 2 weeks?  I told her on the phone we would do this for her yesterday and then forgot to forward this note.  Thanks!  Trey PaulaJeff

## 2013-09-11 NOTE — Telephone Encounter (Signed)
LVM for patient to call back. ?

## 2013-09-14 NOTE — Telephone Encounter (Signed)
Spoke with patient and she is coming in on Tuesday 5/26 @ 10am to see Dr. Gwendolyn GrantWalden

## 2013-09-29 ENCOUNTER — Ambulatory Visit: Payer: Self-pay | Admitting: Family Medicine

## 2013-11-03 ENCOUNTER — Ambulatory Visit: Payer: Self-pay | Admitting: Family Medicine

## 2014-02-12 ENCOUNTER — Other Ambulatory Visit: Payer: Self-pay | Admitting: *Deleted

## 2014-02-15 MED ORDER — HYDROCHLOROTHIAZIDE 25 MG PO TABS: 25.0000 mg | ORAL_TABLET | Freq: Every day | ORAL | Status: DC

## 2014-08-26 IMAGING — CR DG CHEST 2V
2 series · 2 of 2 positions shown · non-contrast
Comparison: 08/14/1998 [DATE]

CLINICAL DATA: Shortness of breath, pneumonia, cough

EXAM:
CHEST  2 VIEW

[w chest pa]
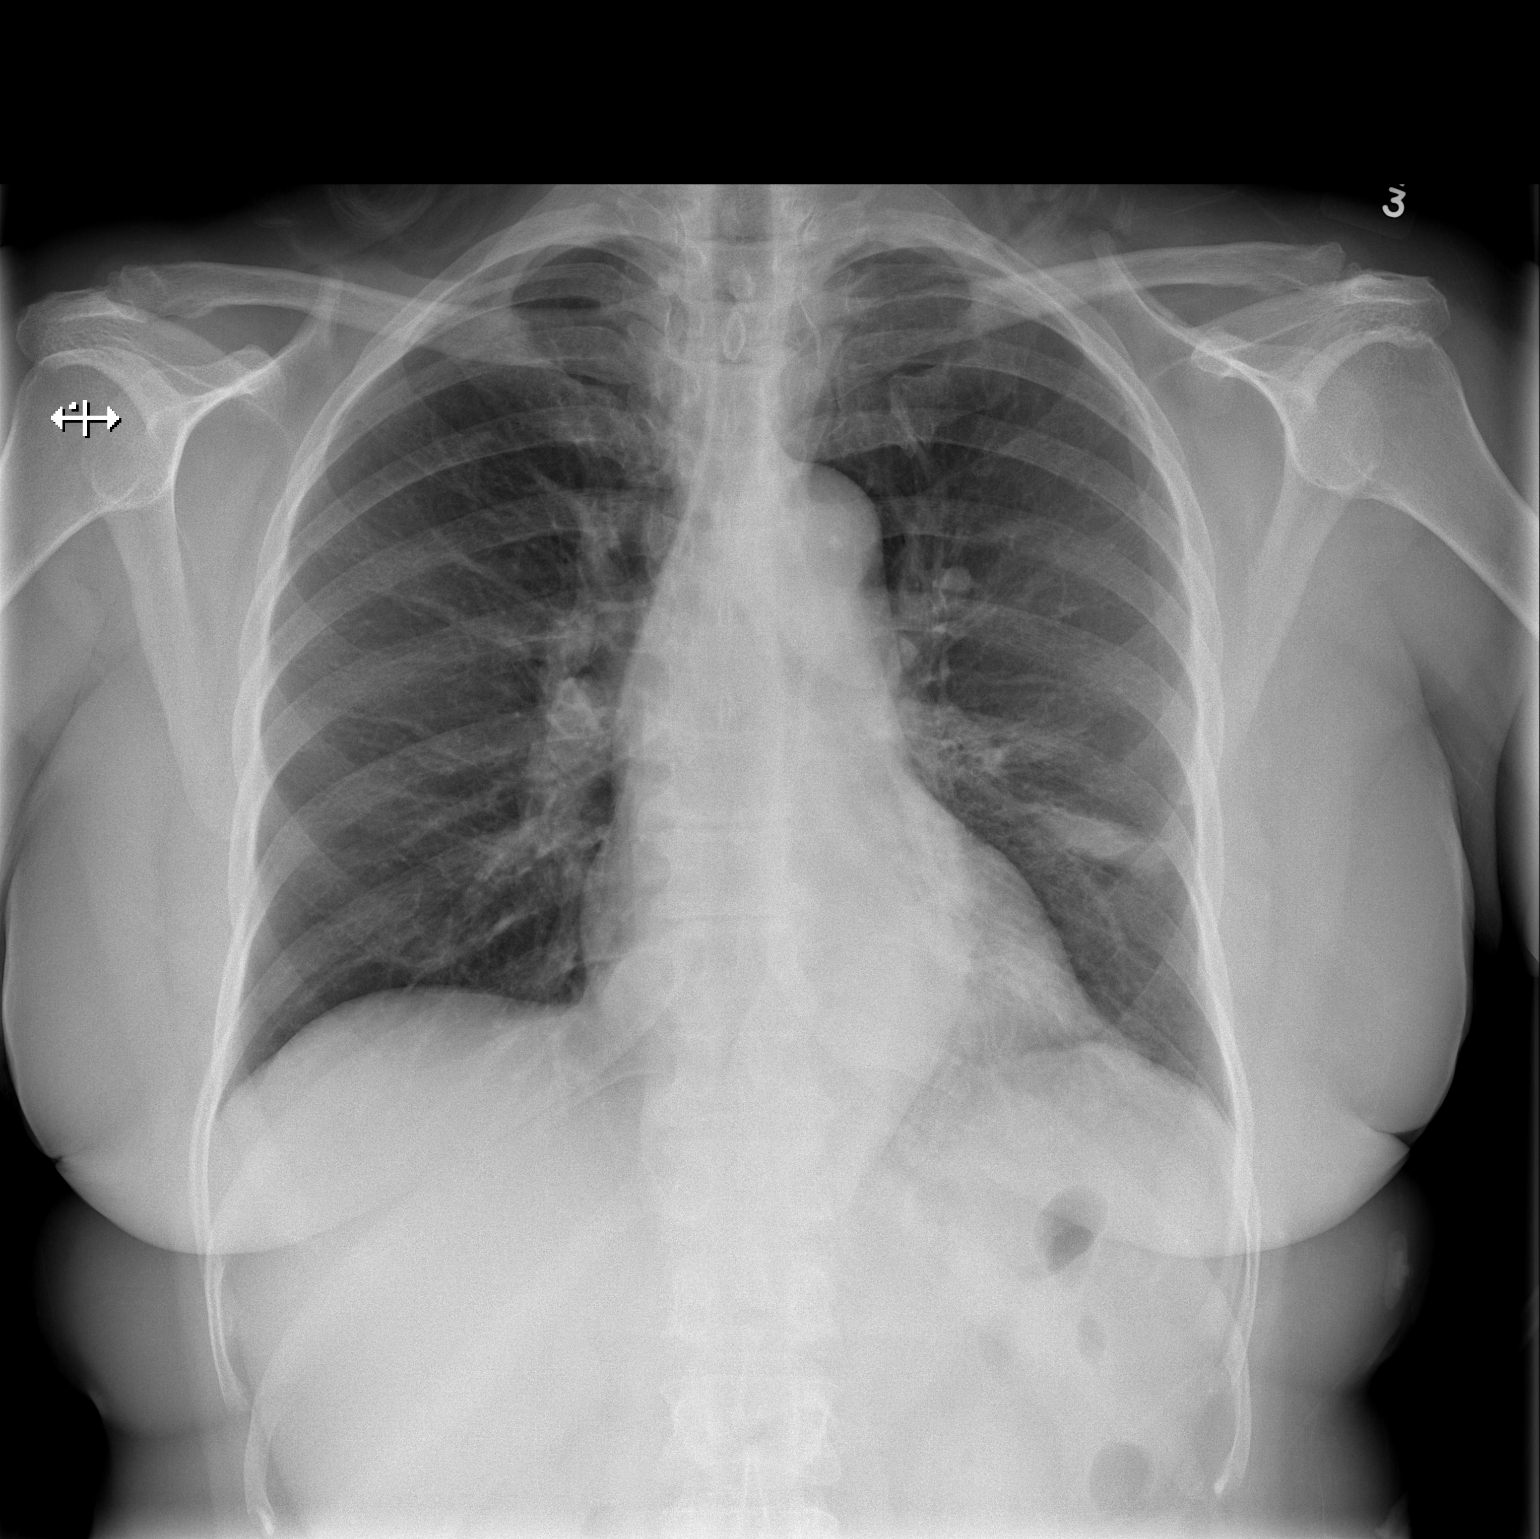

[w chest lat]
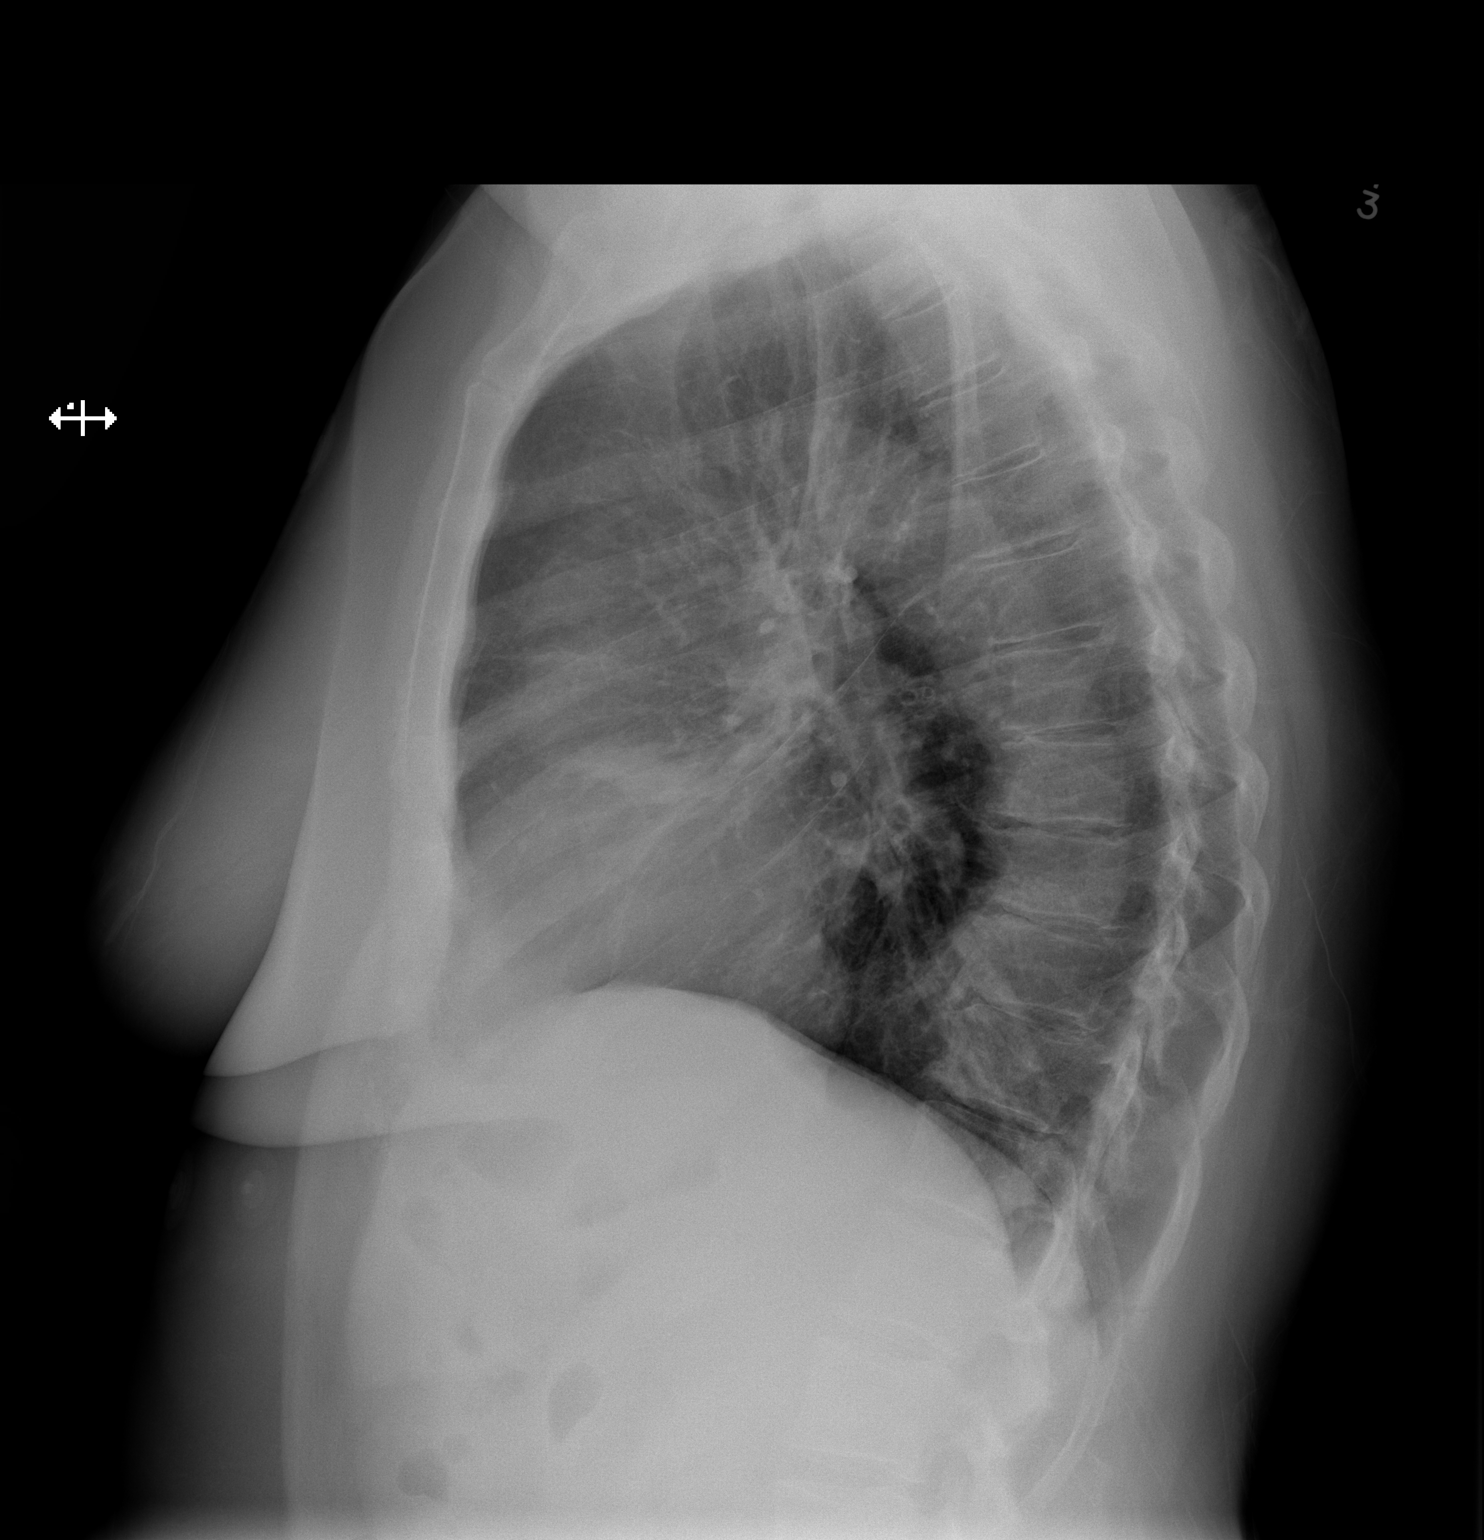

[2 of 2 positions shown; findings below may reference images not displayed]

FINDINGS: Slight improvement in the left upper lobe/lingula streaky airspace
opacity compatible with pneumonia. Right lung remains clear. No
developing effusion or pneumothorax. Normal heart size and
vascularity. Trachea midline. Degenerative changes of the spine.
IMPRESSION: Improving left upper lobe/ lingula pneumonia. Recommend continued
radiographic follow-up to document complete resolution.

## 2014-10-05 ENCOUNTER — Other Ambulatory Visit: Payer: Self-pay | Admitting: *Deleted

## 2014-10-05 MED ORDER — HYDROCHLOROTHIAZIDE 25 MG PO TABS: 25.0000 mg | ORAL_TABLET | Freq: Every day | ORAL | Status: AC
